# Patient Record
Sex: Male | Born: 1950
Health system: Southern US, Community
[De-identification: ages and names within clinical notes are randomized; demographics above are authoritative.]

## PROBLEM LIST (undated history)

## (undated) DIAGNOSIS — I2510 Atherosclerotic heart disease of native coronary artery without angina pectoris: Secondary | ICD-10-CM

## (undated) DIAGNOSIS — E785 Hyperlipidemia, unspecified: Secondary | ICD-10-CM

## (undated) DIAGNOSIS — I219 Acute myocardial infarction, unspecified: Secondary | ICD-10-CM

## (undated) DIAGNOSIS — I10 Essential (primary) hypertension: Secondary | ICD-10-CM

## (undated) HISTORY — PX: REPLACEMENT TOTAL KNEE: SUR1224

## (undated) HISTORY — PX: HIP ARTHROSCOPY W/ LABRAL REPAIR: SHX1750

## (undated) HISTORY — DX: Acute myocardial infarction, unspecified: I21.9

## (undated) HISTORY — DX: Atherosclerotic heart disease of native coronary artery without angina pectoris: I25.10

## (undated) HISTORY — DX: Essential (primary) hypertension: I10

## (undated) HISTORY — DX: Hyperlipidemia, unspecified: E78.5

---

## 2009-01-14 ENCOUNTER — Inpatient Hospital Stay (HOSPITAL_COMMUNITY): Admission: RE | Admit: 2009-01-14 | Discharge: 2009-01-16 | Payer: Self-pay | Admitting: Orthopedic Surgery

## 2010-11-03 LAB — TYPE AND SCREEN: Antibody Screen: NEGATIVE

## 2010-11-03 LAB — DIFFERENTIAL
Basophils Relative: 1 % (ref 0–1)
Lymphocytes Relative: 35 % (ref 12–46)
Monocytes Absolute: 0.4 10*3/uL (ref 0.1–1.0)
Monocytes Relative: 6 % (ref 3–12)
Neutro Abs: 3.2 10*3/uL (ref 1.7–7.7)

## 2010-11-03 LAB — BASIC METABOLIC PANEL
CO2: 29 mEq/L (ref 19–32)
Calcium: 8.4 mg/dL (ref 8.4–10.5)
Calcium: 9.6 mg/dL (ref 8.4–10.5)
Chloride: 103 mEq/L (ref 96–112)
GFR calc Af Amer: >60 mL/min (ref >60)
GFR calc Af Amer: >60 mL/min (ref >60)
GFR calc non Af Amer: >60 mL/min (ref >60)
Sodium: 137 mEq/L (ref 135–145)
Sodium: 139 mEq/L (ref 135–145)

## 2010-11-03 LAB — CBC
HCT: 34.7 % — ABNORMAL LOW (ref 39.0–52.0)
Hemoglobin: 12.1 g/dL — ABNORMAL LOW (ref 13.0–17.0)
Hemoglobin: 12.3 g/dL — ABNORMAL LOW (ref 13.0–17.0)
Hemoglobin: 15 g/dL (ref 13.0–17.0)
MCHC: 34.7 g/dL (ref 30.0–36.0)
Platelets: 208 10*3/uL (ref 150–400)
RBC: 3.87 MIL/uL — ABNORMAL LOW (ref 4.22–5.81)
RBC: 3.95 MIL/uL — ABNORMAL LOW (ref 4.22–5.81)
RBC: 4.81 MIL/uL (ref 4.22–5.81)
WBC: 7.8 10*3/uL (ref 4.0–10.5)
WBC: 8.9 10*3/uL (ref 4.0–10.5)

## 2010-11-03 LAB — APTT: aPTT: 28 seconds (ref 24–37)

## 2010-11-03 LAB — URINALYSIS, ROUTINE W REFLEX MICROSCOPIC
Nitrite: NEGATIVE
Specific Gravity, Urine: 1.015 (ref 1.005–1.030)
pH: 7.5 (ref 5.0–8.0)

## 2010-11-03 LAB — PROTIME-INR: INR: 1.1 (ref 0.00–1.49)

## 2010-12-09 NOTE — Op Note (Signed)
NAMETHOR, NANNINI                ACCOUNT NO.:  [id number]   MEDICAL RECORD NO.:  [id number]          PATIENT TYPE:  INP   LOCATION:  5002                         FACILITY:  [facility]   PHYSICIAN:  Feliberto Gottron. Turner Daniels, M.D.   DATE OF BIRTH:  1950/10/18   DATE OF PROCEDURE:  01/14/2009  DATE OF DISCHARGE:                               OPERATIVE REPORT   PREOPERATIVE DIAGNOSIS:  End-stage arthritis of left hip, osteoarthritic  type with some lateral subluxation of the femoral head.   POSTOPERATIVE DIAGNOSIS:  End-stage arthritis of left hip,  osteoarthritic type with some lateral subluxation of the femoral head.   PROCEDURE:  Left total hip arthroplasty using a DePuy 58-mm pinnacle  cup, a 0 looped 40-mm metal liner 20 x 15 x 42 SROM stem, 20B small  cone, NK +0 40-mm ball.   SURGEON:  Feliberto Gottron. Turner Daniels, MD   FIRST ASSISTANT:  Shirl Harris, PA-C   ANESTHETIC:  Endotracheal.   ESTIMATED BLOOD LOSS:  300 mL.   FLUID REPLACEMENT:  1500 mL of crystalloid.   DRAINS PLACED:  Foley catheter.   URINE OUTPUT:  300 mL.   INDICATIONS FOR PROCEDURE:  Very active, healthy 60 year old gentleman  with end-stage arthritis of his left hip, also has moderate-to-severe  arthritis of both knees, but has maintained generator clinically seems  be as left hip and he is down to bare-bone arthritic changes, has failed  conservative treatment, anti-inflammatory medicines, physical therapy,  he is about 6 feet 1-inch tall, weighs maybe 170 pounds.  He is very fit  and weight loss was not helped him too much.  In any event, he desires  elective left total hip arthroplasty, risks and benefits of surgery  discussed, questions answered.  Because he is very active 60 year old,  putting a large head and do a metal-on-metal bearing system.  Risks and  benefits of surgery have been discussed, questions answered.   DESCRIPTION OF PROCEDURE:  The patient was identified by armband and  received preoperative IV  antibiotics in the holding area at [facility], taken to operating room 10.  Appropriate site monitors were  attached and general endotracheal anesthesia induced with the patient in  supine position, rolled into the right lateral decubitus position after  insertion of a Foley catheter and the right lower extremity was prepped  and draped in usual sterile fashion from the ankle to hemipelvis.  We  then performed a standard time-out procedure and the skin along the  lateral hip and thigh was infiltrated with 20 mL of 0.5% Marcaine and  epinephrine solution.  We then made a posterolateral approach to the hip  joint centered over the greater trochanter about 15 cm in length cutting  through the skin and subcutaneous tissue down to the level of the IT  band, which was split in line with the muscle fibers exposing the  greater trochanter.  Cobra retractors were then placed between the  gluteus minimus and the superior hip joint capsule and between the  quadratus femoris and the inferior hip joint capsule.  This was isolated  with  short external rotators and piriformis, which were tagged with a #2  Ethibond suture and cut off their insertion on the intertrochanteric  crest.  This exposed the hip joint capsule which was then developed into  an acetabular-based flap going from posterior-superior off the  acetabular rim out over the femoral neck at the insertion of the capsule  and then exiting posterior-inferior off the acetabular rim.  The flap  was tagged to #2 Ethibond sutures exposing the arthritic bare-bone  femoral head.  The hip was flexed and internally rotated after releasing  about 50% of the rectus femoris fibers and dislocated.  A standard neck  cut was performed one fingerbreadth above the lesser trochanter, pretty  much perpendicular to the femoral neck and head was excised, examined,  and found to be down to bare bone.  We then translated the proximal  femur anteriorly  levering off the anterior column with a Homan  retractor.  A posterior inferior wing retractor was then placed at the  junction of the ileum and acetabulum and finally a spike Cobra was  placed in the cotyloid notch exposing the acetabulum.  The labrum was  then excised with electrocautery and we sequentially reamed up to a 57-  mm basket reamer obtaining good coverage in all quadrants and then just  touched the edge of the acetabulum of the 58-mm basket reamer not  seating it.  Peripheral osteophytes anteriorly and posteriorly were  removed at this time.  The wound was irrigated out with normal saline  solution and a 58-mm pinnacle cup was inserted at 45 degrees of  abduction and about 20 degrees of anteversion.  Excellent fit and fill  was accomplished and no supplemental screws were required.  The central  occluder was placed followed by the 40-mm metal liner, which was seated  nicely and was hammered into place.  The hip was then flexed and  internally rotated exposing the proximal femur, which was entered with  the box-cutting chisel followed by initiating reamer and then axial  reaming up to a 14-mm axial reamer in 1-mm increments.  We then went in  0.5-mm increments up to 15.5 obtaining chatter at 14, 14.5, 15 and went  half way down with a 15.5 reamer.  Satisfied with the femoral  preparation, we then conically reamed proximally to a 20B cone to the  appropriate depth for 42 base neck followed by calcar milling to a 20D  small calcar.  The trial cone was then placed, a trial stem with a 42  base neck in the same version as the neck of the femur about 15-20  degrees, was then hammered into place with an NK +0 trial ball.  The hip  reduced at 90 flexion, you could internally rotate to almost 75 degrees  before there was any instability in extension, you could externally  rotate to about 40 degrees and you could not dislocate anteriorly.  At  this point, the trial components were  removed.  The wound irrigated out  with normal saline solution and a 20B small CTT1 cone was hammered into  place followed by a 20 x 15 x 42 SROM stem in the same version as the  cone.  Once the stem was seated.  The trunnion was cleansed with saline  and then dried with a sponge and a NK +0 40-mm ball was hammered onto  the trunnion and the hip again reduced.  Stability was excellent.  This  capsular flap and short external  rotators were repaired back to the  intertrochanteric crest through drill holes.  The IT band was closed  with running #1 Vicryl suture, the subcutaneous tissue with 2-0 undyed  Vicryl suture because he was so slender and the skin with running  interlocking 3-0 nylon suture.  A dressing of Xeroform and Mepilex was  then applied.  The patient was unclamped, rolled supine, awakened and  taken to the recovery room without difficulty.      Feliberto Gottron. Turner Daniels, M.D.  Electronically Signed     FJR/MEDQ  D:  01/14/2009  T:  01/14/2009  Job:  [id number]

## 2010-12-12 NOTE — Discharge Summary (Signed)
Ruben Jackson, Ruben Jackson                ACCOUNT NO.:  [id number]   MEDICAL RECORD NO.:  [id number]          PATIENT TYPE:  INP   LOCATION:  5002                         FACILITY:  [facility]   PHYSICIAN:  Feliberto Gottron. Turner Daniels, M.D.   DATE OF BIRTH:  [birth year]   DATE OF ADMISSION:  01/14/2009  DATE OF DISCHARGE:  01/16/2009                               DISCHARGE SUMMARY   CHIEF COMPLAINT:  Left hip pain.   HISTORY OF PRESENT ILLNESS:  This is a 60 year old gentleman who  complains of severe unremitting pain in his left hip despite  conservative treatment with antiinflammatories, narcotic pain medicines,  and activity modifications.  He desires a surgical intervention at this  time.  All risks and benefits of surgery were discussed with the  patient.   PAST MEDICAL HISTORY:  Significant for hypertension.   PAST SURGICAL HISTORY:  He had a tonsillectomy at age 46.   ALLERGIES:  He has an allergy to PENICILLIN.   CURRENT MEDICATIONS:  1. Ibuprofen 200 mg one p.o. daily.  2. Aspirin 325 mg one p.o. daily.  3. Claritin 10 mg one p.o. daily.   SOCIAL HISTORY:  He denies use of alcohol or tobacco.   FAMILY HISTORY:  Positive for osteoarthritis.   PHYSICAL EXAMINATION:  Gross examination of the left hip demonstrates  the patient to have tenderness with internal rotation.  He walks with an  antalgic gait and is neurovascularly intact.  X-rays demonstrate bone-on-  bone degenerative joint disease of the left hip.   PREOPERATIVE LABORATORIES:  White blood cells 5.8, red blood cells 4.81,  hemoglobin 15, hematocrit 43.2, platelets 257.  PT 12.4, INR 0.9, PTT  28.  Sodium 139, potassium 4.6, chloride 103, glucose 108, BUN 15,  creatinine 0.89.   HOSPITAL COURSE:  Mr. Delcarlo was admitted to [facility] on January 14, 2009, when he underwent left total hip arthroplasty.  The procedure was  performed by Dr. Gean Birchwood and the patient tolerated it well.  A  perioperative Foley catheter was placed.   The patient was transferred  from recovery to the orthopedic floor and placed on Lovenox and Coumadin  for DVT prophylaxis.  On the first postoperative day, the patient was  awake and alert and tolerating p.o. intake well.  He denied any nausea  or vomiting and said that his pain was well controlled.  He was afebrile  and his hemoglobin was 12.3.  Surgical dressing was partially soaked.  He was evaluated by Physical Therapy and he ambulated 70 feet.  On the  second postoperative day, the patient was awake and alert.  He was  passing flatus and stool and continued to report good control of his  pain.  His hemoglobin was 12.1.  His dressing was partially soaked and  his incision was found to be benign.  He was discharged home after  physical therapy.   DISPOSITION:  The patient was discharged home on January 16, 2009.  He was  weightbearing as tolerated.  Home health would manage his wound,  Coumadin, and physical therapy.  He would return to  the clinic to see  Dr. Turner Daniels in 7-10 days for x-rays and suture removal.   Discharge medicines were as per the HMR with the addition of Percocet 5  mg 1-2 tablets p.o. q.4 h. p.r.n. pain and Coumadin to take as directed  with a target INR of 1.5 to 2.   FINAL DIAGNOSIS:  End-stage degenerative joint disease of the left hip.      Shirl Harris, PA      Feliberto Gottron. Turner Daniels, M.D.  Electronically Signed    JW/MEDQ  D:  02/06/2009  T:  02/07/2009  Job:  [id number]

## 2011-02-12 IMAGING — CR DG PORTABLE PELVIS
1 series · 1 of 1 positions shown · non-contrast
Comparison: None available.

CLINICAL DATA: Hip replacement.

PORTABLE PELVIS

[view not recorded]
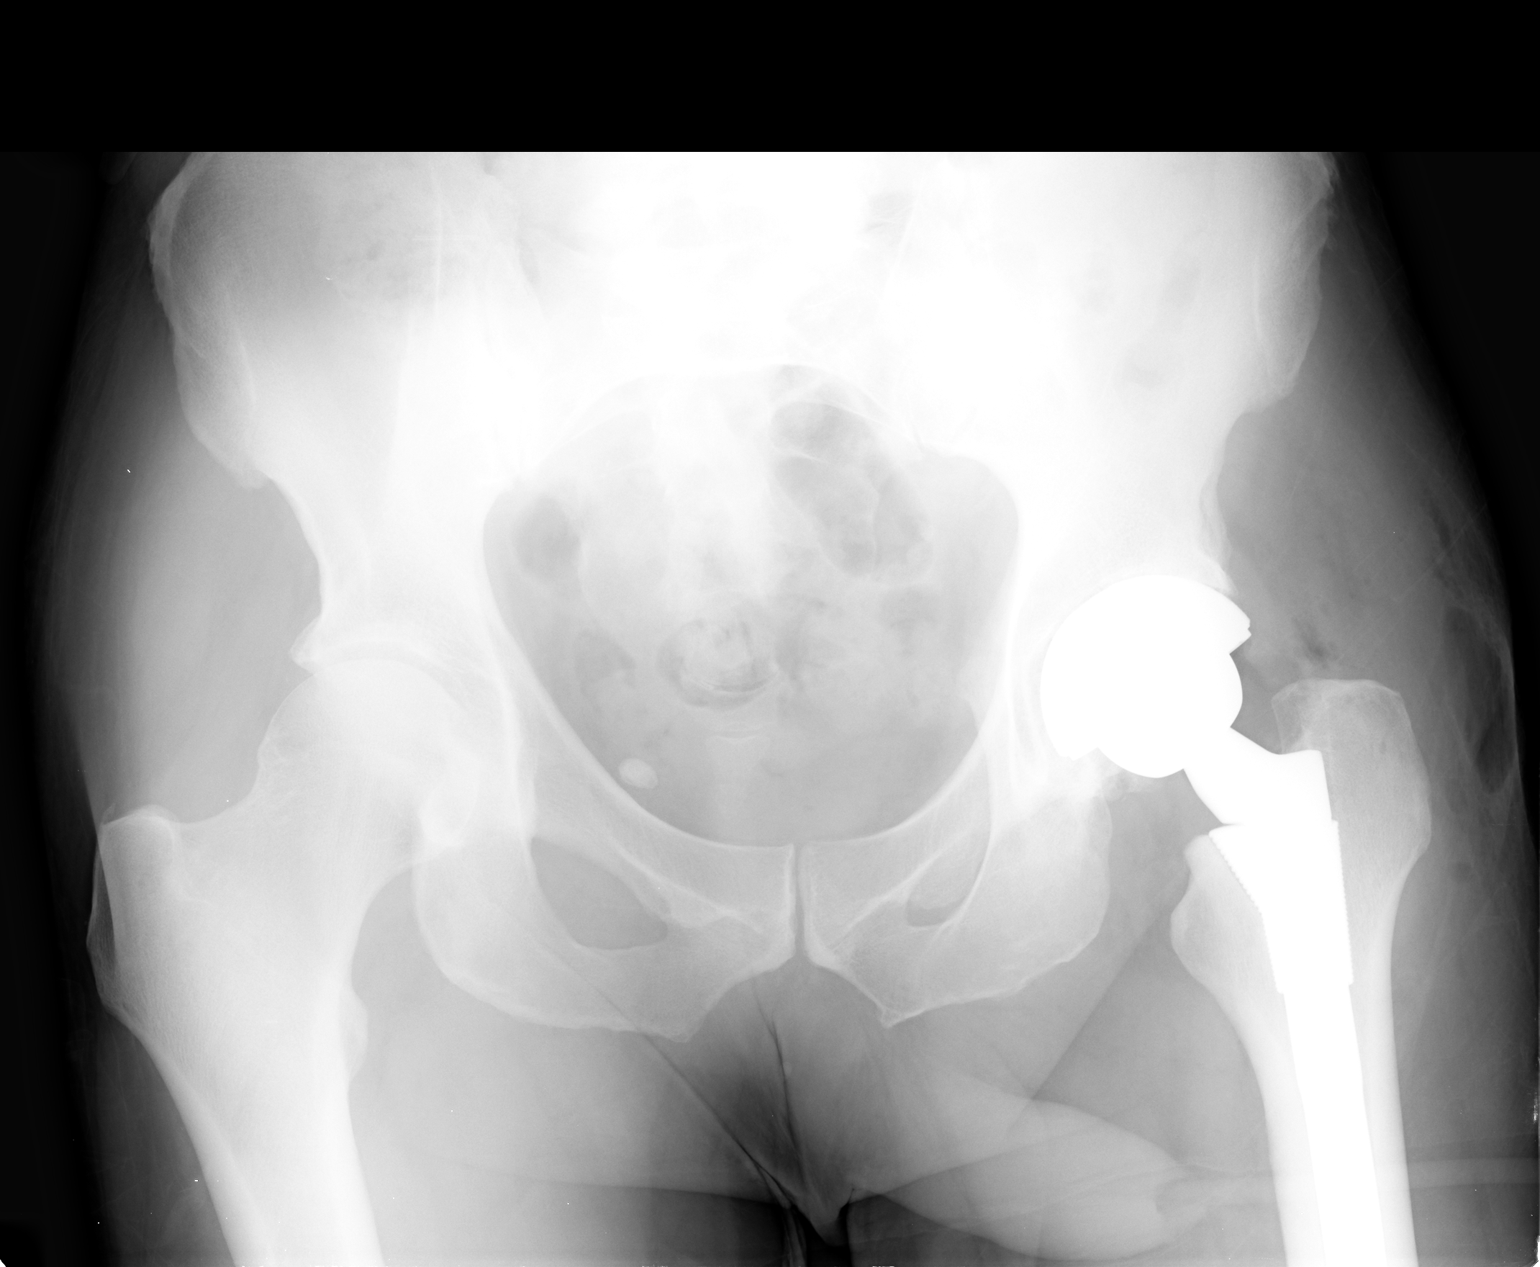

[1 of 1 positions shown; findings below may reference images not displayed]

FINDINGS: The patient has a left total hip arthroplasty.  The
device is located.  No fracture is identified.  Gas in the soft
tissues from surgery noted.  Right hip osteoarthritis also noted.
IMPRESSION: Left total hip replacement without evidence of complication.

## 2014-07-16 DIAGNOSIS — S8991XA Unspecified injury of right lower leg, initial encounter: Secondary | ICD-10-CM | POA: Diagnosis not present

## 2014-07-16 DIAGNOSIS — S81831A Puncture wound without foreign body, right lower leg, initial encounter: Secondary | ICD-10-CM | POA: Diagnosis not present

## 2014-07-24 DIAGNOSIS — N400 Enlarged prostate without lower urinary tract symptoms: Secondary | ICD-10-CM | POA: Diagnosis not present

## 2014-07-24 DIAGNOSIS — I2510 Atherosclerotic heart disease of native coronary artery without angina pectoris: Secondary | ICD-10-CM | POA: Diagnosis not present

## 2014-07-24 DIAGNOSIS — I10 Essential (primary) hypertension: Secondary | ICD-10-CM | POA: Diagnosis not present

## 2014-07-24 DIAGNOSIS — E785 Hyperlipidemia, unspecified: Secondary | ICD-10-CM | POA: Diagnosis not present

## 2014-08-03 DIAGNOSIS — E785 Hyperlipidemia, unspecified: Secondary | ICD-10-CM | POA: Diagnosis not present

## 2014-08-03 DIAGNOSIS — I252 Old myocardial infarction: Secondary | ICD-10-CM | POA: Diagnosis not present

## 2014-08-03 DIAGNOSIS — I2510 Atherosclerotic heart disease of native coronary artery without angina pectoris: Secondary | ICD-10-CM | POA: Diagnosis not present

## 2014-09-07 DIAGNOSIS — M12532 Traumatic arthropathy, left wrist: Secondary | ICD-10-CM | POA: Diagnosis not present

## 2014-09-22 DIAGNOSIS — D225 Melanocytic nevi of trunk: Secondary | ICD-10-CM | POA: Diagnosis not present

## 2014-09-22 DIAGNOSIS — D485 Neoplasm of uncertain behavior of skin: Secondary | ICD-10-CM | POA: Diagnosis not present

## 2014-09-22 DIAGNOSIS — L820 Inflamed seborrheic keratosis: Secondary | ICD-10-CM | POA: Diagnosis not present

## 2014-11-02 DIAGNOSIS — E785 Hyperlipidemia, unspecified: Secondary | ICD-10-CM | POA: Diagnosis not present

## 2014-11-02 DIAGNOSIS — E781 Pure hyperglyceridemia: Secondary | ICD-10-CM | POA: Diagnosis not present

## 2014-11-02 DIAGNOSIS — I10 Essential (primary) hypertension: Secondary | ICD-10-CM | POA: Diagnosis not present

## 2014-11-02 DIAGNOSIS — M791 Myalgia: Secondary | ICD-10-CM | POA: Diagnosis not present

## 2014-11-30 DIAGNOSIS — H524 Presbyopia: Secondary | ICD-10-CM | POA: Diagnosis not present

## 2014-11-30 DIAGNOSIS — H43813 Vitreous degeneration, bilateral: Secondary | ICD-10-CM | POA: Diagnosis not present

## 2014-11-30 DIAGNOSIS — H2513 Age-related nuclear cataract, bilateral: Secondary | ICD-10-CM | POA: Diagnosis not present

## 2014-12-07 DIAGNOSIS — I2510 Atherosclerotic heart disease of native coronary artery without angina pectoris: Secondary | ICD-10-CM | POA: Diagnosis not present

## 2014-12-07 DIAGNOSIS — E785 Hyperlipidemia, unspecified: Secondary | ICD-10-CM | POA: Diagnosis not present

## 2014-12-07 DIAGNOSIS — I10 Essential (primary) hypertension: Secondary | ICD-10-CM | POA: Diagnosis not present

## 2015-01-12 DIAGNOSIS — M67432 Ganglion, left wrist: Secondary | ICD-10-CM | POA: Diagnosis not present

## 2015-03-22 DIAGNOSIS — E785 Hyperlipidemia, unspecified: Secondary | ICD-10-CM | POA: Diagnosis not present

## 2015-03-22 DIAGNOSIS — I10 Essential (primary) hypertension: Secondary | ICD-10-CM | POA: Diagnosis not present

## 2015-03-22 DIAGNOSIS — N529 Male erectile dysfunction, unspecified: Secondary | ICD-10-CM | POA: Diagnosis not present

## 2015-03-22 DIAGNOSIS — I2510 Atherosclerotic heart disease of native coronary artery without angina pectoris: Secondary | ICD-10-CM | POA: Diagnosis not present

## 2015-06-14 DIAGNOSIS — H532 Diplopia: Secondary | ICD-10-CM | POA: Diagnosis not present

## 2015-07-05 DIAGNOSIS — N400 Enlarged prostate without lower urinary tract symptoms: Secondary | ICD-10-CM | POA: Diagnosis not present

## 2015-07-05 DIAGNOSIS — E785 Hyperlipidemia, unspecified: Secondary | ICD-10-CM | POA: Diagnosis not present

## 2015-07-05 DIAGNOSIS — I10 Essential (primary) hypertension: Secondary | ICD-10-CM | POA: Diagnosis not present

## 2015-07-05 DIAGNOSIS — Z23 Encounter for immunization: Secondary | ICD-10-CM | POA: Diagnosis not present

## 2015-07-05 DIAGNOSIS — I2510 Atherosclerotic heart disease of native coronary artery without angina pectoris: Secondary | ICD-10-CM | POA: Diagnosis not present

## 2015-07-05 DIAGNOSIS — N529 Male erectile dysfunction, unspecified: Secondary | ICD-10-CM | POA: Diagnosis not present

## 2015-08-23 DIAGNOSIS — N529 Male erectile dysfunction, unspecified: Secondary | ICD-10-CM | POA: Diagnosis not present

## 2015-09-20 DIAGNOSIS — N529 Male erectile dysfunction, unspecified: Secondary | ICD-10-CM | POA: Diagnosis not present

## 2015-10-18 DIAGNOSIS — N529 Male erectile dysfunction, unspecified: Secondary | ICD-10-CM | POA: Diagnosis not present

## 2015-10-18 DIAGNOSIS — R1031 Right lower quadrant pain: Secondary | ICD-10-CM | POA: Diagnosis not present

## 2015-10-18 DIAGNOSIS — I2510 Atherosclerotic heart disease of native coronary artery without angina pectoris: Secondary | ICD-10-CM | POA: Diagnosis not present

## 2015-10-18 DIAGNOSIS — E785 Hyperlipidemia, unspecified: Secondary | ICD-10-CM | POA: Diagnosis not present

## 2015-10-18 DIAGNOSIS — I10 Essential (primary) hypertension: Secondary | ICD-10-CM | POA: Diagnosis not present

## 2015-11-18 DIAGNOSIS — N529 Male erectile dysfunction, unspecified: Secondary | ICD-10-CM | POA: Diagnosis not present

## 2015-11-19 DIAGNOSIS — M1712 Unilateral primary osteoarthritis, left knee: Secondary | ICD-10-CM | POA: Diagnosis not present

## 2015-12-18 DIAGNOSIS — N529 Male erectile dysfunction, unspecified: Secondary | ICD-10-CM | POA: Diagnosis not present

## 2015-12-29 DIAGNOSIS — H81399 Other peripheral vertigo, unspecified ear: Secondary | ICD-10-CM | POA: Diagnosis not present

## 2016-02-10 DIAGNOSIS — E785 Hyperlipidemia, unspecified: Secondary | ICD-10-CM | POA: Insufficient documentation

## 2016-02-10 DIAGNOSIS — I2510 Atherosclerotic heart disease of native coronary artery without angina pectoris: Secondary | ICD-10-CM

## 2016-02-10 DIAGNOSIS — I252 Old myocardial infarction: Secondary | ICD-10-CM | POA: Insufficient documentation

## 2016-02-10 HISTORY — DX: Atherosclerotic heart disease of native coronary artery without angina pectoris: I25.10

## 2016-02-10 HISTORY — DX: Old myocardial infarction: I25.2

## 2016-02-11 DIAGNOSIS — Z789 Other specified health status: Secondary | ICD-10-CM

## 2016-02-11 DIAGNOSIS — E785 Hyperlipidemia, unspecified: Secondary | ICD-10-CM | POA: Diagnosis not present

## 2016-02-11 DIAGNOSIS — I252 Old myocardial infarction: Secondary | ICD-10-CM | POA: Diagnosis not present

## 2016-02-11 DIAGNOSIS — M6281 Muscle weakness (generalized): Secondary | ICD-10-CM | POA: Insufficient documentation

## 2016-02-11 DIAGNOSIS — I2510 Atherosclerotic heart disease of native coronary artery without angina pectoris: Secondary | ICD-10-CM | POA: Diagnosis not present

## 2016-02-11 HISTORY — DX: Other specified health status: Z78.9

## 2016-02-11 HISTORY — DX: Muscle weakness (generalized): M62.81

## 2016-03-25 ENCOUNTER — Other Ambulatory Visit: Payer: Self-pay

## 2016-03-31 DIAGNOSIS — Z6823 Body mass index (BMI) 23.0-23.9, adult: Secondary | ICD-10-CM | POA: Diagnosis not present

## 2016-03-31 DIAGNOSIS — I2510 Atherosclerotic heart disease of native coronary artery without angina pectoris: Secondary | ICD-10-CM | POA: Diagnosis not present

## 2016-03-31 DIAGNOSIS — I10 Essential (primary) hypertension: Secondary | ICD-10-CM | POA: Diagnosis not present

## 2016-03-31 DIAGNOSIS — E785 Hyperlipidemia, unspecified: Secondary | ICD-10-CM | POA: Diagnosis not present

## 2016-03-31 DIAGNOSIS — Z23 Encounter for immunization: Secondary | ICD-10-CM | POA: Diagnosis not present

## 2016-04-20 DIAGNOSIS — I10 Essential (primary) hypertension: Secondary | ICD-10-CM | POA: Diagnosis not present

## 2016-04-20 DIAGNOSIS — M791 Myalgia: Secondary | ICD-10-CM | POA: Diagnosis not present

## 2016-06-11 DIAGNOSIS — M1711 Unilateral primary osteoarthritis, right knee: Secondary | ICD-10-CM | POA: Diagnosis not present

## 2016-06-15 DIAGNOSIS — W5503XA Scratched by cat, initial encounter: Secondary | ICD-10-CM | POA: Diagnosis not present

## 2016-06-15 DIAGNOSIS — S60519A Abrasion of unspecified hand, initial encounter: Secondary | ICD-10-CM | POA: Diagnosis not present

## 2016-06-24 DIAGNOSIS — Z01810 Encounter for preprocedural cardiovascular examination: Secondary | ICD-10-CM | POA: Diagnosis not present

## 2016-06-24 DIAGNOSIS — R52 Pain, unspecified: Secondary | ICD-10-CM | POA: Diagnosis not present

## 2016-06-24 DIAGNOSIS — Z01818 Encounter for other preprocedural examination: Secondary | ICD-10-CM | POA: Diagnosis not present

## 2016-06-24 DIAGNOSIS — M79609 Pain in unspecified limb: Secondary | ICD-10-CM | POA: Diagnosis not present

## 2016-06-24 DIAGNOSIS — Z79899 Other long term (current) drug therapy: Secondary | ICD-10-CM | POA: Diagnosis not present

## 2016-06-29 DIAGNOSIS — I2510 Atherosclerotic heart disease of native coronary artery without angina pectoris: Secondary | ICD-10-CM | POA: Diagnosis not present

## 2016-06-29 DIAGNOSIS — Z1389 Encounter for screening for other disorder: Secondary | ICD-10-CM | POA: Diagnosis not present

## 2016-06-29 DIAGNOSIS — H6121 Impacted cerumen, right ear: Secondary | ICD-10-CM | POA: Diagnosis not present

## 2016-06-29 DIAGNOSIS — R351 Nocturia: Secondary | ICD-10-CM | POA: Diagnosis not present

## 2016-06-29 DIAGNOSIS — E782 Mixed hyperlipidemia: Secondary | ICD-10-CM | POA: Diagnosis not present

## 2016-06-29 DIAGNOSIS — I10 Essential (primary) hypertension: Secondary | ICD-10-CM | POA: Diagnosis not present

## 2016-06-29 DIAGNOSIS — Z6823 Body mass index (BMI) 23.0-23.9, adult: Secondary | ICD-10-CM | POA: Diagnosis not present

## 2016-06-29 DIAGNOSIS — Z1212 Encounter for screening for malignant neoplasm of rectum: Secondary | ICD-10-CM | POA: Diagnosis not present

## 2016-06-29 DIAGNOSIS — E785 Hyperlipidemia, unspecified: Secondary | ICD-10-CM | POA: Diagnosis not present

## 2016-07-09 DIAGNOSIS — Z1212 Encounter for screening for malignant neoplasm of rectum: Secondary | ICD-10-CM | POA: Diagnosis not present

## 2016-07-09 DIAGNOSIS — Z1211 Encounter for screening for malignant neoplasm of colon: Secondary | ICD-10-CM | POA: Diagnosis not present

## 2016-07-14 DIAGNOSIS — J0100 Acute maxillary sinusitis, unspecified: Secondary | ICD-10-CM | POA: Diagnosis not present

## 2016-08-10 DIAGNOSIS — Z01818 Encounter for other preprocedural examination: Secondary | ICD-10-CM | POA: Diagnosis not present

## 2016-08-10 DIAGNOSIS — Z79899 Other long term (current) drug therapy: Secondary | ICD-10-CM | POA: Diagnosis not present

## 2016-08-10 DIAGNOSIS — M1711 Unilateral primary osteoarthritis, right knee: Secondary | ICD-10-CM | POA: Diagnosis not present

## 2016-08-19 DIAGNOSIS — I252 Old myocardial infarction: Secondary | ICD-10-CM | POA: Diagnosis not present

## 2016-08-19 DIAGNOSIS — Z7982 Long term (current) use of aspirin: Secondary | ICD-10-CM | POA: Diagnosis not present

## 2016-08-19 DIAGNOSIS — Z880 Allergy status to penicillin: Secondary | ICD-10-CM | POA: Diagnosis not present

## 2016-08-19 DIAGNOSIS — M1711 Unilateral primary osteoarthritis, right knee: Secondary | ICD-10-CM | POA: Diagnosis not present

## 2016-08-19 DIAGNOSIS — I10 Essential (primary) hypertension: Secondary | ICD-10-CM | POA: Diagnosis present

## 2016-08-19 DIAGNOSIS — G8918 Other acute postprocedural pain: Secondary | ICD-10-CM | POA: Diagnosis not present

## 2016-08-19 DIAGNOSIS — Z79899 Other long term (current) drug therapy: Secondary | ICD-10-CM | POA: Diagnosis not present

## 2016-08-19 DIAGNOSIS — E785 Hyperlipidemia, unspecified: Secondary | ICD-10-CM | POA: Diagnosis present

## 2016-08-19 DIAGNOSIS — Z96651 Presence of right artificial knee joint: Secondary | ICD-10-CM | POA: Diagnosis not present

## 2016-08-19 DIAGNOSIS — M7989 Other specified soft tissue disorders: Secondary | ICD-10-CM | POA: Diagnosis not present

## 2016-08-19 DIAGNOSIS — Z955 Presence of coronary angioplasty implant and graft: Secondary | ICD-10-CM | POA: Diagnosis not present

## 2016-08-19 DIAGNOSIS — Z471 Aftercare following joint replacement surgery: Secondary | ICD-10-CM | POA: Diagnosis not present

## 2016-08-23 DIAGNOSIS — Z471 Aftercare following joint replacement surgery: Secondary | ICD-10-CM | POA: Diagnosis not present

## 2016-08-23 DIAGNOSIS — I2510 Atherosclerotic heart disease of native coronary artery without angina pectoris: Secondary | ICD-10-CM | POA: Diagnosis not present

## 2016-08-23 DIAGNOSIS — I10 Essential (primary) hypertension: Secondary | ICD-10-CM | POA: Diagnosis not present

## 2016-08-23 DIAGNOSIS — Z7982 Long term (current) use of aspirin: Secondary | ICD-10-CM | POA: Diagnosis not present

## 2016-08-23 DIAGNOSIS — Z96651 Presence of right artificial knee joint: Secondary | ICD-10-CM | POA: Diagnosis not present

## 2016-08-24 DIAGNOSIS — Z96651 Presence of right artificial knee joint: Secondary | ICD-10-CM | POA: Diagnosis not present

## 2016-08-24 DIAGNOSIS — I10 Essential (primary) hypertension: Secondary | ICD-10-CM | POA: Diagnosis not present

## 2016-08-24 DIAGNOSIS — I2510 Atherosclerotic heart disease of native coronary artery without angina pectoris: Secondary | ICD-10-CM | POA: Diagnosis not present

## 2016-08-24 DIAGNOSIS — Z7982 Long term (current) use of aspirin: Secondary | ICD-10-CM | POA: Diagnosis not present

## 2016-08-24 DIAGNOSIS — Z471 Aftercare following joint replacement surgery: Secondary | ICD-10-CM | POA: Diagnosis not present

## 2016-08-25 DIAGNOSIS — Z471 Aftercare following joint replacement surgery: Secondary | ICD-10-CM | POA: Diagnosis not present

## 2016-08-25 DIAGNOSIS — I2510 Atherosclerotic heart disease of native coronary artery without angina pectoris: Secondary | ICD-10-CM | POA: Diagnosis not present

## 2016-08-25 DIAGNOSIS — Z7982 Long term (current) use of aspirin: Secondary | ICD-10-CM | POA: Diagnosis not present

## 2016-08-25 DIAGNOSIS — Z96651 Presence of right artificial knee joint: Secondary | ICD-10-CM | POA: Diagnosis not present

## 2016-08-25 DIAGNOSIS — I10 Essential (primary) hypertension: Secondary | ICD-10-CM | POA: Diagnosis not present

## 2016-08-26 DIAGNOSIS — Z471 Aftercare following joint replacement surgery: Secondary | ICD-10-CM | POA: Diagnosis not present

## 2016-08-26 DIAGNOSIS — Z96651 Presence of right artificial knee joint: Secondary | ICD-10-CM | POA: Diagnosis not present

## 2016-08-26 DIAGNOSIS — I10 Essential (primary) hypertension: Secondary | ICD-10-CM | POA: Diagnosis not present

## 2016-08-26 DIAGNOSIS — Z7982 Long term (current) use of aspirin: Secondary | ICD-10-CM | POA: Diagnosis not present

## 2016-08-26 DIAGNOSIS — I2510 Atherosclerotic heart disease of native coronary artery without angina pectoris: Secondary | ICD-10-CM | POA: Diagnosis not present

## 2016-08-27 DIAGNOSIS — I10 Essential (primary) hypertension: Secondary | ICD-10-CM | POA: Diagnosis not present

## 2016-08-27 DIAGNOSIS — Z471 Aftercare following joint replacement surgery: Secondary | ICD-10-CM | POA: Diagnosis not present

## 2016-08-27 DIAGNOSIS — Z7982 Long term (current) use of aspirin: Secondary | ICD-10-CM | POA: Diagnosis not present

## 2016-08-27 DIAGNOSIS — Z96651 Presence of right artificial knee joint: Secondary | ICD-10-CM | POA: Diagnosis not present

## 2016-08-27 DIAGNOSIS — I2510 Atherosclerotic heart disease of native coronary artery without angina pectoris: Secondary | ICD-10-CM | POA: Diagnosis not present

## 2016-08-28 DIAGNOSIS — Z7982 Long term (current) use of aspirin: Secondary | ICD-10-CM | POA: Diagnosis not present

## 2016-08-28 DIAGNOSIS — I10 Essential (primary) hypertension: Secondary | ICD-10-CM | POA: Diagnosis not present

## 2016-08-28 DIAGNOSIS — I2510 Atherosclerotic heart disease of native coronary artery without angina pectoris: Secondary | ICD-10-CM | POA: Diagnosis not present

## 2016-08-28 DIAGNOSIS — Z471 Aftercare following joint replacement surgery: Secondary | ICD-10-CM | POA: Diagnosis not present

## 2016-08-28 DIAGNOSIS — Z96651 Presence of right artificial knee joint: Secondary | ICD-10-CM | POA: Diagnosis not present

## 2016-08-31 DIAGNOSIS — I2510 Atherosclerotic heart disease of native coronary artery without angina pectoris: Secondary | ICD-10-CM | POA: Diagnosis not present

## 2016-08-31 DIAGNOSIS — Z7982 Long term (current) use of aspirin: Secondary | ICD-10-CM | POA: Diagnosis not present

## 2016-08-31 DIAGNOSIS — Z471 Aftercare following joint replacement surgery: Secondary | ICD-10-CM | POA: Diagnosis not present

## 2016-08-31 DIAGNOSIS — Z96651 Presence of right artificial knee joint: Secondary | ICD-10-CM | POA: Diagnosis not present

## 2016-08-31 DIAGNOSIS — I10 Essential (primary) hypertension: Secondary | ICD-10-CM | POA: Diagnosis not present

## 2016-09-02 DIAGNOSIS — M1711 Unilateral primary osteoarthritis, right knee: Secondary | ICD-10-CM | POA: Diagnosis not present

## 2016-09-02 DIAGNOSIS — R2689 Other abnormalities of gait and mobility: Secondary | ICD-10-CM | POA: Diagnosis not present

## 2016-09-02 DIAGNOSIS — M25561 Pain in right knee: Secondary | ICD-10-CM | POA: Diagnosis not present

## 2016-09-04 DIAGNOSIS — M1711 Unilateral primary osteoarthritis, right knee: Secondary | ICD-10-CM | POA: Diagnosis not present

## 2016-09-04 DIAGNOSIS — R2689 Other abnormalities of gait and mobility: Secondary | ICD-10-CM | POA: Diagnosis not present

## 2016-09-04 DIAGNOSIS — M25561 Pain in right knee: Secondary | ICD-10-CM | POA: Diagnosis not present

## 2016-09-08 DIAGNOSIS — R2689 Other abnormalities of gait and mobility: Secondary | ICD-10-CM | POA: Diagnosis not present

## 2016-09-08 DIAGNOSIS — M1711 Unilateral primary osteoarthritis, right knee: Secondary | ICD-10-CM | POA: Diagnosis not present

## 2016-09-08 DIAGNOSIS — M25561 Pain in right knee: Secondary | ICD-10-CM | POA: Diagnosis not present

## 2016-09-11 DIAGNOSIS — M25561 Pain in right knee: Secondary | ICD-10-CM | POA: Diagnosis not present

## 2016-09-11 DIAGNOSIS — M1711 Unilateral primary osteoarthritis, right knee: Secondary | ICD-10-CM | POA: Diagnosis not present

## 2016-09-11 DIAGNOSIS — R2689 Other abnormalities of gait and mobility: Secondary | ICD-10-CM | POA: Diagnosis not present

## 2016-09-15 DIAGNOSIS — M1711 Unilateral primary osteoarthritis, right knee: Secondary | ICD-10-CM | POA: Diagnosis not present

## 2016-09-15 DIAGNOSIS — M25561 Pain in right knee: Secondary | ICD-10-CM | POA: Diagnosis not present

## 2016-09-15 DIAGNOSIS — R2689 Other abnormalities of gait and mobility: Secondary | ICD-10-CM | POA: Diagnosis not present

## 2016-09-18 DIAGNOSIS — M25561 Pain in right knee: Secondary | ICD-10-CM | POA: Diagnosis not present

## 2016-09-18 DIAGNOSIS — R2689 Other abnormalities of gait and mobility: Secondary | ICD-10-CM | POA: Diagnosis not present

## 2016-09-18 DIAGNOSIS — M1711 Unilateral primary osteoarthritis, right knee: Secondary | ICD-10-CM | POA: Diagnosis not present

## 2016-09-22 DIAGNOSIS — M1711 Unilateral primary osteoarthritis, right knee: Secondary | ICD-10-CM | POA: Diagnosis not present

## 2016-09-22 DIAGNOSIS — R2689 Other abnormalities of gait and mobility: Secondary | ICD-10-CM | POA: Diagnosis not present

## 2016-09-22 DIAGNOSIS — M25561 Pain in right knee: Secondary | ICD-10-CM | POA: Diagnosis not present

## 2016-09-25 DIAGNOSIS — M25561 Pain in right knee: Secondary | ICD-10-CM | POA: Diagnosis not present

## 2016-09-25 DIAGNOSIS — R2689 Other abnormalities of gait and mobility: Secondary | ICD-10-CM | POA: Diagnosis not present

## 2016-09-25 DIAGNOSIS — M1711 Unilateral primary osteoarthritis, right knee: Secondary | ICD-10-CM | POA: Diagnosis not present

## 2016-09-29 DIAGNOSIS — M1711 Unilateral primary osteoarthritis, right knee: Secondary | ICD-10-CM | POA: Diagnosis not present

## 2016-09-29 DIAGNOSIS — M1712 Unilateral primary osteoarthritis, left knee: Secondary | ICD-10-CM | POA: Diagnosis not present

## 2016-09-29 DIAGNOSIS — Z96651 Presence of right artificial knee joint: Secondary | ICD-10-CM | POA: Diagnosis not present

## 2016-09-29 DIAGNOSIS — R2689 Other abnormalities of gait and mobility: Secondary | ICD-10-CM | POA: Diagnosis not present

## 2016-09-29 DIAGNOSIS — M25561 Pain in right knee: Secondary | ICD-10-CM | POA: Diagnosis not present

## 2016-09-30 DIAGNOSIS — R52 Pain, unspecified: Secondary | ICD-10-CM | POA: Diagnosis not present

## 2016-09-30 DIAGNOSIS — Z01818 Encounter for other preprocedural examination: Secondary | ICD-10-CM | POA: Diagnosis not present

## 2016-09-30 DIAGNOSIS — Z01810 Encounter for preprocedural cardiovascular examination: Secondary | ICD-10-CM | POA: Diagnosis not present

## 2016-09-30 DIAGNOSIS — Z79899 Other long term (current) drug therapy: Secondary | ICD-10-CM | POA: Diagnosis not present

## 2016-09-30 DIAGNOSIS — Z96652 Presence of left artificial knee joint: Secondary | ICD-10-CM | POA: Diagnosis not present

## 2016-09-30 DIAGNOSIS — Z471 Aftercare following joint replacement surgery: Secondary | ICD-10-CM | POA: Diagnosis not present

## 2016-09-30 DIAGNOSIS — M79609 Pain in unspecified limb: Secondary | ICD-10-CM | POA: Diagnosis not present

## 2016-10-01 DIAGNOSIS — R2689 Other abnormalities of gait and mobility: Secondary | ICD-10-CM | POA: Diagnosis not present

## 2016-10-01 DIAGNOSIS — M25561 Pain in right knee: Secondary | ICD-10-CM | POA: Diagnosis not present

## 2016-10-01 DIAGNOSIS — M1711 Unilateral primary osteoarthritis, right knee: Secondary | ICD-10-CM | POA: Diagnosis not present

## 2016-10-06 DIAGNOSIS — G8929 Other chronic pain: Secondary | ICD-10-CM | POA: Diagnosis not present

## 2016-10-06 DIAGNOSIS — Z01818 Encounter for other preprocedural examination: Secondary | ICD-10-CM | POA: Diagnosis not present

## 2016-10-06 DIAGNOSIS — M25561 Pain in right knee: Secondary | ICD-10-CM | POA: Diagnosis not present

## 2016-10-06 DIAGNOSIS — M1711 Unilateral primary osteoarthritis, right knee: Secondary | ICD-10-CM | POA: Diagnosis not present

## 2016-10-06 DIAGNOSIS — I10 Essential (primary) hypertension: Secondary | ICD-10-CM | POA: Diagnosis not present

## 2016-10-06 DIAGNOSIS — E785 Hyperlipidemia, unspecified: Secondary | ICD-10-CM | POA: Diagnosis not present

## 2016-10-06 DIAGNOSIS — R2689 Other abnormalities of gait and mobility: Secondary | ICD-10-CM | POA: Diagnosis not present

## 2016-10-08 DIAGNOSIS — M1711 Unilateral primary osteoarthritis, right knee: Secondary | ICD-10-CM | POA: Diagnosis not present

## 2016-10-08 DIAGNOSIS — M25561 Pain in right knee: Secondary | ICD-10-CM | POA: Diagnosis not present

## 2016-10-08 DIAGNOSIS — R2689 Other abnormalities of gait and mobility: Secondary | ICD-10-CM | POA: Diagnosis not present

## 2016-10-19 DIAGNOSIS — M1712 Unilateral primary osteoarthritis, left knee: Secondary | ICD-10-CM | POA: Diagnosis not present

## 2016-10-19 DIAGNOSIS — Z01818 Encounter for other preprocedural examination: Secondary | ICD-10-CM | POA: Diagnosis not present

## 2016-11-18 DIAGNOSIS — E785 Hyperlipidemia, unspecified: Secondary | ICD-10-CM | POA: Diagnosis present

## 2016-11-18 DIAGNOSIS — Z79899 Other long term (current) drug therapy: Secondary | ICD-10-CM | POA: Diagnosis not present

## 2016-11-18 DIAGNOSIS — M1712 Unilateral primary osteoarthritis, left knee: Secondary | ICD-10-CM | POA: Diagnosis present

## 2016-11-18 DIAGNOSIS — Z79891 Long term (current) use of opiate analgesic: Secondary | ICD-10-CM | POA: Diagnosis not present

## 2016-11-18 DIAGNOSIS — I252 Old myocardial infarction: Secondary | ICD-10-CM | POA: Diagnosis not present

## 2016-11-18 DIAGNOSIS — I10 Essential (primary) hypertension: Secondary | ICD-10-CM | POA: Diagnosis present

## 2016-11-18 DIAGNOSIS — Z7982 Long term (current) use of aspirin: Secondary | ICD-10-CM | POA: Diagnosis not present

## 2016-11-18 DIAGNOSIS — I2510 Atherosclerotic heart disease of native coronary artery without angina pectoris: Secondary | ICD-10-CM | POA: Diagnosis not present

## 2016-11-18 DIAGNOSIS — Z471 Aftercare following joint replacement surgery: Secondary | ICD-10-CM | POA: Diagnosis not present

## 2016-11-18 DIAGNOSIS — Z96652 Presence of left artificial knee joint: Secondary | ICD-10-CM | POA: Diagnosis not present

## 2016-11-20 DIAGNOSIS — Z7982 Long term (current) use of aspirin: Secondary | ICD-10-CM | POA: Diagnosis not present

## 2016-11-20 DIAGNOSIS — Z79891 Long term (current) use of opiate analgesic: Secondary | ICD-10-CM | POA: Diagnosis not present

## 2016-11-20 DIAGNOSIS — I2510 Atherosclerotic heart disease of native coronary artery without angina pectoris: Secondary | ICD-10-CM | POA: Diagnosis not present

## 2016-11-20 DIAGNOSIS — Z471 Aftercare following joint replacement surgery: Secondary | ICD-10-CM | POA: Diagnosis not present

## 2016-11-20 DIAGNOSIS — Z96652 Presence of left artificial knee joint: Secondary | ICD-10-CM | POA: Diagnosis not present

## 2016-11-20 DIAGNOSIS — I10 Essential (primary) hypertension: Secondary | ICD-10-CM | POA: Diagnosis not present

## 2016-11-22 DIAGNOSIS — I10 Essential (primary) hypertension: Secondary | ICD-10-CM | POA: Diagnosis not present

## 2016-11-22 DIAGNOSIS — Z79891 Long term (current) use of opiate analgesic: Secondary | ICD-10-CM | POA: Diagnosis not present

## 2016-11-22 DIAGNOSIS — Z96652 Presence of left artificial knee joint: Secondary | ICD-10-CM | POA: Diagnosis not present

## 2016-11-22 DIAGNOSIS — I2510 Atherosclerotic heart disease of native coronary artery without angina pectoris: Secondary | ICD-10-CM | POA: Diagnosis not present

## 2016-11-22 DIAGNOSIS — Z471 Aftercare following joint replacement surgery: Secondary | ICD-10-CM | POA: Diagnosis not present

## 2016-11-22 DIAGNOSIS — Z7982 Long term (current) use of aspirin: Secondary | ICD-10-CM | POA: Diagnosis not present

## 2016-11-23 DIAGNOSIS — I2510 Atherosclerotic heart disease of native coronary artery without angina pectoris: Secondary | ICD-10-CM | POA: Diagnosis not present

## 2016-11-23 DIAGNOSIS — Z471 Aftercare following joint replacement surgery: Secondary | ICD-10-CM | POA: Diagnosis not present

## 2016-11-23 DIAGNOSIS — Z79891 Long term (current) use of opiate analgesic: Secondary | ICD-10-CM | POA: Diagnosis not present

## 2016-11-23 DIAGNOSIS — Z7982 Long term (current) use of aspirin: Secondary | ICD-10-CM | POA: Diagnosis not present

## 2016-11-23 DIAGNOSIS — I10 Essential (primary) hypertension: Secondary | ICD-10-CM | POA: Diagnosis not present

## 2016-11-23 DIAGNOSIS — Z96652 Presence of left artificial knee joint: Secondary | ICD-10-CM | POA: Diagnosis not present

## 2016-11-24 DIAGNOSIS — Z471 Aftercare following joint replacement surgery: Secondary | ICD-10-CM | POA: Diagnosis not present

## 2016-11-24 DIAGNOSIS — I10 Essential (primary) hypertension: Secondary | ICD-10-CM | POA: Diagnosis not present

## 2016-11-24 DIAGNOSIS — Z7982 Long term (current) use of aspirin: Secondary | ICD-10-CM | POA: Diagnosis not present

## 2016-11-24 DIAGNOSIS — Z79891 Long term (current) use of opiate analgesic: Secondary | ICD-10-CM | POA: Diagnosis not present

## 2016-11-24 DIAGNOSIS — I2510 Atherosclerotic heart disease of native coronary artery without angina pectoris: Secondary | ICD-10-CM | POA: Diagnosis not present

## 2016-11-24 DIAGNOSIS — Z96652 Presence of left artificial knee joint: Secondary | ICD-10-CM | POA: Diagnosis not present

## 2016-11-25 DIAGNOSIS — Z471 Aftercare following joint replacement surgery: Secondary | ICD-10-CM | POA: Diagnosis not present

## 2016-11-25 DIAGNOSIS — Z79891 Long term (current) use of opiate analgesic: Secondary | ICD-10-CM | POA: Diagnosis not present

## 2016-11-25 DIAGNOSIS — Z7982 Long term (current) use of aspirin: Secondary | ICD-10-CM | POA: Diagnosis not present

## 2016-11-25 DIAGNOSIS — I10 Essential (primary) hypertension: Secondary | ICD-10-CM | POA: Diagnosis not present

## 2016-11-25 DIAGNOSIS — I2510 Atherosclerotic heart disease of native coronary artery without angina pectoris: Secondary | ICD-10-CM | POA: Diagnosis not present

## 2016-11-25 DIAGNOSIS — Z96652 Presence of left artificial knee joint: Secondary | ICD-10-CM | POA: Diagnosis not present

## 2016-11-27 DIAGNOSIS — Z79891 Long term (current) use of opiate analgesic: Secondary | ICD-10-CM | POA: Diagnosis not present

## 2016-11-27 DIAGNOSIS — Z471 Aftercare following joint replacement surgery: Secondary | ICD-10-CM | POA: Diagnosis not present

## 2016-11-27 DIAGNOSIS — I10 Essential (primary) hypertension: Secondary | ICD-10-CM | POA: Diagnosis not present

## 2016-11-27 DIAGNOSIS — Z96652 Presence of left artificial knee joint: Secondary | ICD-10-CM | POA: Diagnosis not present

## 2016-11-27 DIAGNOSIS — I2510 Atherosclerotic heart disease of native coronary artery without angina pectoris: Secondary | ICD-10-CM | POA: Diagnosis not present

## 2016-11-27 DIAGNOSIS — Z7982 Long term (current) use of aspirin: Secondary | ICD-10-CM | POA: Diagnosis not present

## 2016-11-30 DIAGNOSIS — Z471 Aftercare following joint replacement surgery: Secondary | ICD-10-CM | POA: Diagnosis not present

## 2016-11-30 DIAGNOSIS — Z79891 Long term (current) use of opiate analgesic: Secondary | ICD-10-CM | POA: Diagnosis not present

## 2016-11-30 DIAGNOSIS — Z7982 Long term (current) use of aspirin: Secondary | ICD-10-CM | POA: Diagnosis not present

## 2016-11-30 DIAGNOSIS — I2510 Atherosclerotic heart disease of native coronary artery without angina pectoris: Secondary | ICD-10-CM | POA: Diagnosis not present

## 2016-11-30 DIAGNOSIS — I10 Essential (primary) hypertension: Secondary | ICD-10-CM | POA: Diagnosis not present

## 2016-11-30 DIAGNOSIS — Z96652 Presence of left artificial knee joint: Secondary | ICD-10-CM | POA: Diagnosis not present

## 2016-12-02 DIAGNOSIS — M25662 Stiffness of left knee, not elsewhere classified: Secondary | ICD-10-CM | POA: Diagnosis not present

## 2016-12-02 DIAGNOSIS — M25562 Pain in left knee: Secondary | ICD-10-CM | POA: Diagnosis not present

## 2016-12-02 DIAGNOSIS — M1712 Unilateral primary osteoarthritis, left knee: Secondary | ICD-10-CM | POA: Diagnosis not present

## 2016-12-02 DIAGNOSIS — M6281 Muscle weakness (generalized): Secondary | ICD-10-CM | POA: Diagnosis not present

## 2016-12-02 DIAGNOSIS — R2689 Other abnormalities of gait and mobility: Secondary | ICD-10-CM | POA: Diagnosis not present

## 2016-12-04 DIAGNOSIS — M25662 Stiffness of left knee, not elsewhere classified: Secondary | ICD-10-CM | POA: Diagnosis not present

## 2016-12-04 DIAGNOSIS — M1712 Unilateral primary osteoarthritis, left knee: Secondary | ICD-10-CM | POA: Diagnosis not present

## 2016-12-04 DIAGNOSIS — M6281 Muscle weakness (generalized): Secondary | ICD-10-CM | POA: Diagnosis not present

## 2016-12-04 DIAGNOSIS — R2689 Other abnormalities of gait and mobility: Secondary | ICD-10-CM | POA: Diagnosis not present

## 2016-12-04 DIAGNOSIS — M25562 Pain in left knee: Secondary | ICD-10-CM | POA: Diagnosis not present

## 2016-12-08 DIAGNOSIS — M25562 Pain in left knee: Secondary | ICD-10-CM | POA: Diagnosis not present

## 2016-12-08 DIAGNOSIS — M25662 Stiffness of left knee, not elsewhere classified: Secondary | ICD-10-CM | POA: Diagnosis not present

## 2016-12-08 DIAGNOSIS — R2689 Other abnormalities of gait and mobility: Secondary | ICD-10-CM | POA: Diagnosis not present

## 2016-12-08 DIAGNOSIS — M6281 Muscle weakness (generalized): Secondary | ICD-10-CM | POA: Diagnosis not present

## 2016-12-08 DIAGNOSIS — M1712 Unilateral primary osteoarthritis, left knee: Secondary | ICD-10-CM | POA: Diagnosis not present

## 2016-12-11 DIAGNOSIS — M25562 Pain in left knee: Secondary | ICD-10-CM | POA: Diagnosis not present

## 2016-12-11 DIAGNOSIS — M1712 Unilateral primary osteoarthritis, left knee: Secondary | ICD-10-CM | POA: Diagnosis not present

## 2016-12-11 DIAGNOSIS — R2689 Other abnormalities of gait and mobility: Secondary | ICD-10-CM | POA: Diagnosis not present

## 2016-12-11 DIAGNOSIS — M25662 Stiffness of left knee, not elsewhere classified: Secondary | ICD-10-CM | POA: Diagnosis not present

## 2016-12-11 DIAGNOSIS — M6281 Muscle weakness (generalized): Secondary | ICD-10-CM | POA: Diagnosis not present

## 2016-12-14 DIAGNOSIS — M25662 Stiffness of left knee, not elsewhere classified: Secondary | ICD-10-CM | POA: Diagnosis not present

## 2016-12-14 DIAGNOSIS — R2689 Other abnormalities of gait and mobility: Secondary | ICD-10-CM | POA: Diagnosis not present

## 2016-12-14 DIAGNOSIS — M25562 Pain in left knee: Secondary | ICD-10-CM | POA: Diagnosis not present

## 2016-12-14 DIAGNOSIS — M6281 Muscle weakness (generalized): Secondary | ICD-10-CM | POA: Diagnosis not present

## 2016-12-14 DIAGNOSIS — M1712 Unilateral primary osteoarthritis, left knee: Secondary | ICD-10-CM | POA: Diagnosis not present

## 2016-12-16 DIAGNOSIS — M25562 Pain in left knee: Secondary | ICD-10-CM | POA: Diagnosis not present

## 2016-12-16 DIAGNOSIS — M6281 Muscle weakness (generalized): Secondary | ICD-10-CM | POA: Diagnosis not present

## 2016-12-16 DIAGNOSIS — R2689 Other abnormalities of gait and mobility: Secondary | ICD-10-CM | POA: Diagnosis not present

## 2016-12-16 DIAGNOSIS — M1712 Unilateral primary osteoarthritis, left knee: Secondary | ICD-10-CM | POA: Diagnosis not present

## 2016-12-16 DIAGNOSIS — M25662 Stiffness of left knee, not elsewhere classified: Secondary | ICD-10-CM | POA: Diagnosis not present

## 2016-12-23 DIAGNOSIS — M25562 Pain in left knee: Secondary | ICD-10-CM | POA: Diagnosis not present

## 2016-12-23 DIAGNOSIS — M1712 Unilateral primary osteoarthritis, left knee: Secondary | ICD-10-CM | POA: Diagnosis not present

## 2016-12-23 DIAGNOSIS — M6281 Muscle weakness (generalized): Secondary | ICD-10-CM | POA: Diagnosis not present

## 2016-12-23 DIAGNOSIS — R2689 Other abnormalities of gait and mobility: Secondary | ICD-10-CM | POA: Diagnosis not present

## 2016-12-23 DIAGNOSIS — M25662 Stiffness of left knee, not elsewhere classified: Secondary | ICD-10-CM | POA: Diagnosis not present

## 2016-12-25 DIAGNOSIS — M25662 Stiffness of left knee, not elsewhere classified: Secondary | ICD-10-CM | POA: Diagnosis not present

## 2016-12-25 DIAGNOSIS — M1712 Unilateral primary osteoarthritis, left knee: Secondary | ICD-10-CM | POA: Diagnosis not present

## 2016-12-25 DIAGNOSIS — M25562 Pain in left knee: Secondary | ICD-10-CM | POA: Diagnosis not present

## 2016-12-25 DIAGNOSIS — R2689 Other abnormalities of gait and mobility: Secondary | ICD-10-CM | POA: Diagnosis not present

## 2016-12-25 DIAGNOSIS — M6281 Muscle weakness (generalized): Secondary | ICD-10-CM | POA: Diagnosis not present

## 2016-12-29 DIAGNOSIS — Z96652 Presence of left artificial knee joint: Secondary | ICD-10-CM | POA: Diagnosis not present

## 2016-12-29 DIAGNOSIS — M25662 Stiffness of left knee, not elsewhere classified: Secondary | ICD-10-CM | POA: Diagnosis not present

## 2016-12-29 DIAGNOSIS — R2689 Other abnormalities of gait and mobility: Secondary | ICD-10-CM | POA: Diagnosis not present

## 2016-12-29 DIAGNOSIS — M25562 Pain in left knee: Secondary | ICD-10-CM | POA: Diagnosis not present

## 2016-12-29 DIAGNOSIS — M6281 Muscle weakness (generalized): Secondary | ICD-10-CM | POA: Diagnosis not present

## 2016-12-29 DIAGNOSIS — M1712 Unilateral primary osteoarthritis, left knee: Secondary | ICD-10-CM | POA: Diagnosis not present

## 2017-01-01 DIAGNOSIS — M1712 Unilateral primary osteoarthritis, left knee: Secondary | ICD-10-CM | POA: Diagnosis not present

## 2017-01-01 DIAGNOSIS — R2689 Other abnormalities of gait and mobility: Secondary | ICD-10-CM | POA: Diagnosis not present

## 2017-01-01 DIAGNOSIS — M25662 Stiffness of left knee, not elsewhere classified: Secondary | ICD-10-CM | POA: Diagnosis not present

## 2017-01-01 DIAGNOSIS — M25562 Pain in left knee: Secondary | ICD-10-CM | POA: Diagnosis not present

## 2017-01-01 DIAGNOSIS — M6281 Muscle weakness (generalized): Secondary | ICD-10-CM | POA: Diagnosis not present

## 2017-01-05 DIAGNOSIS — R2689 Other abnormalities of gait and mobility: Secondary | ICD-10-CM | POA: Diagnosis not present

## 2017-01-05 DIAGNOSIS — M6281 Muscle weakness (generalized): Secondary | ICD-10-CM | POA: Diagnosis not present

## 2017-01-05 DIAGNOSIS — M25562 Pain in left knee: Secondary | ICD-10-CM | POA: Diagnosis not present

## 2017-01-05 DIAGNOSIS — M25662 Stiffness of left knee, not elsewhere classified: Secondary | ICD-10-CM | POA: Diagnosis not present

## 2017-01-05 DIAGNOSIS — M1712 Unilateral primary osteoarthritis, left knee: Secondary | ICD-10-CM | POA: Diagnosis not present

## 2017-01-06 DIAGNOSIS — E785 Hyperlipidemia, unspecified: Secondary | ICD-10-CM | POA: Diagnosis not present

## 2017-01-06 DIAGNOSIS — G4700 Insomnia, unspecified: Secondary | ICD-10-CM | POA: Diagnosis not present

## 2017-01-06 DIAGNOSIS — I10 Essential (primary) hypertension: Secondary | ICD-10-CM | POA: Diagnosis not present

## 2017-01-08 DIAGNOSIS — M1712 Unilateral primary osteoarthritis, left knee: Secondary | ICD-10-CM | POA: Diagnosis not present

## 2017-01-08 DIAGNOSIS — M6281 Muscle weakness (generalized): Secondary | ICD-10-CM | POA: Diagnosis not present

## 2017-01-08 DIAGNOSIS — M25562 Pain in left knee: Secondary | ICD-10-CM | POA: Diagnosis not present

## 2017-01-08 DIAGNOSIS — M25662 Stiffness of left knee, not elsewhere classified: Secondary | ICD-10-CM | POA: Diagnosis not present

## 2017-01-08 DIAGNOSIS — R2689 Other abnormalities of gait and mobility: Secondary | ICD-10-CM | POA: Diagnosis not present

## 2017-01-12 DIAGNOSIS — M1712 Unilateral primary osteoarthritis, left knee: Secondary | ICD-10-CM | POA: Diagnosis not present

## 2017-01-12 DIAGNOSIS — R2689 Other abnormalities of gait and mobility: Secondary | ICD-10-CM | POA: Diagnosis not present

## 2017-01-12 DIAGNOSIS — M6281 Muscle weakness (generalized): Secondary | ICD-10-CM | POA: Diagnosis not present

## 2017-01-12 DIAGNOSIS — M25562 Pain in left knee: Secondary | ICD-10-CM | POA: Diagnosis not present

## 2017-01-12 DIAGNOSIS — M25662 Stiffness of left knee, not elsewhere classified: Secondary | ICD-10-CM | POA: Diagnosis not present

## 2017-01-14 DIAGNOSIS — R2689 Other abnormalities of gait and mobility: Secondary | ICD-10-CM | POA: Diagnosis not present

## 2017-01-14 DIAGNOSIS — M1712 Unilateral primary osteoarthritis, left knee: Secondary | ICD-10-CM | POA: Diagnosis not present

## 2017-01-14 DIAGNOSIS — M25662 Stiffness of left knee, not elsewhere classified: Secondary | ICD-10-CM | POA: Diagnosis not present

## 2017-01-14 DIAGNOSIS — M6281 Muscle weakness (generalized): Secondary | ICD-10-CM | POA: Diagnosis not present

## 2017-01-14 DIAGNOSIS — M25562 Pain in left knee: Secondary | ICD-10-CM | POA: Diagnosis not present

## 2017-01-19 DIAGNOSIS — M1712 Unilateral primary osteoarthritis, left knee: Secondary | ICD-10-CM | POA: Diagnosis not present

## 2017-01-19 DIAGNOSIS — R2689 Other abnormalities of gait and mobility: Secondary | ICD-10-CM | POA: Diagnosis not present

## 2017-01-19 DIAGNOSIS — M6281 Muscle weakness (generalized): Secondary | ICD-10-CM | POA: Diagnosis not present

## 2017-01-19 DIAGNOSIS — M25562 Pain in left knee: Secondary | ICD-10-CM | POA: Diagnosis not present

## 2017-01-19 DIAGNOSIS — M25662 Stiffness of left knee, not elsewhere classified: Secondary | ICD-10-CM | POA: Diagnosis not present

## 2017-01-21 DIAGNOSIS — M25662 Stiffness of left knee, not elsewhere classified: Secondary | ICD-10-CM | POA: Diagnosis not present

## 2017-01-21 DIAGNOSIS — M1712 Unilateral primary osteoarthritis, left knee: Secondary | ICD-10-CM | POA: Diagnosis not present

## 2017-01-21 DIAGNOSIS — M25562 Pain in left knee: Secondary | ICD-10-CM | POA: Diagnosis not present

## 2017-01-21 DIAGNOSIS — M6281 Muscle weakness (generalized): Secondary | ICD-10-CM | POA: Diagnosis not present

## 2017-01-21 DIAGNOSIS — R2689 Other abnormalities of gait and mobility: Secondary | ICD-10-CM | POA: Diagnosis not present

## 2017-02-05 DIAGNOSIS — E785 Hyperlipidemia, unspecified: Secondary | ICD-10-CM | POA: Diagnosis not present

## 2017-02-05 DIAGNOSIS — I10 Essential (primary) hypertension: Secondary | ICD-10-CM | POA: Diagnosis not present

## 2017-02-05 DIAGNOSIS — M542 Cervicalgia: Secondary | ICD-10-CM | POA: Diagnosis not present

## 2017-02-05 DIAGNOSIS — G4700 Insomnia, unspecified: Secondary | ICD-10-CM | POA: Diagnosis not present

## 2017-02-09 DIAGNOSIS — M1712 Unilateral primary osteoarthritis, left knee: Secondary | ICD-10-CM | POA: Diagnosis not present

## 2017-02-09 DIAGNOSIS — Z96652 Presence of left artificial knee joint: Secondary | ICD-10-CM | POA: Diagnosis not present

## 2017-03-18 DIAGNOSIS — H2513 Age-related nuclear cataract, bilateral: Secondary | ICD-10-CM | POA: Diagnosis not present

## 2017-04-01 DIAGNOSIS — H25812 Combined forms of age-related cataract, left eye: Secondary | ICD-10-CM | POA: Diagnosis not present

## 2017-04-01 DIAGNOSIS — H2512 Age-related nuclear cataract, left eye: Secondary | ICD-10-CM | POA: Diagnosis not present

## 2017-04-01 DIAGNOSIS — H52202 Unspecified astigmatism, left eye: Secondary | ICD-10-CM | POA: Diagnosis not present

## 2017-04-01 DIAGNOSIS — Z01818 Encounter for other preprocedural examination: Secondary | ICD-10-CM | POA: Diagnosis not present

## 2017-04-01 DIAGNOSIS — H353131 Nonexudative age-related macular degeneration, bilateral, early dry stage: Secondary | ICD-10-CM | POA: Diagnosis not present

## 2017-04-12 DIAGNOSIS — H52221 Regular astigmatism, right eye: Secondary | ICD-10-CM | POA: Diagnosis not present

## 2017-04-12 DIAGNOSIS — H2511 Age-related nuclear cataract, right eye: Secondary | ICD-10-CM | POA: Diagnosis not present

## 2017-04-12 DIAGNOSIS — H25811 Combined forms of age-related cataract, right eye: Secondary | ICD-10-CM | POA: Diagnosis not present

## 2017-05-10 DIAGNOSIS — M542 Cervicalgia: Secondary | ICD-10-CM | POA: Diagnosis not present

## 2017-05-10 DIAGNOSIS — E785 Hyperlipidemia, unspecified: Secondary | ICD-10-CM | POA: Diagnosis not present

## 2017-05-10 DIAGNOSIS — N521 Erectile dysfunction due to diseases classified elsewhere: Secondary | ICD-10-CM | POA: Diagnosis not present

## 2017-05-10 DIAGNOSIS — I10 Essential (primary) hypertension: Secondary | ICD-10-CM | POA: Diagnosis not present

## 2017-05-10 DIAGNOSIS — Z23 Encounter for immunization: Secondary | ICD-10-CM | POA: Diagnosis not present

## 2017-06-30 DIAGNOSIS — I252 Old myocardial infarction: Secondary | ICD-10-CM | POA: Diagnosis not present

## 2017-06-30 DIAGNOSIS — Z1212 Encounter for screening for malignant neoplasm of rectum: Secondary | ICD-10-CM | POA: Diagnosis not present

## 2017-06-30 DIAGNOSIS — E785 Hyperlipidemia, unspecified: Secondary | ICD-10-CM

## 2017-06-30 DIAGNOSIS — I2510 Atherosclerotic heart disease of native coronary artery without angina pectoris: Secondary | ICD-10-CM | POA: Diagnosis not present

## 2017-06-30 DIAGNOSIS — Z7982 Long term (current) use of aspirin: Secondary | ICD-10-CM | POA: Diagnosis not present

## 2017-06-30 DIAGNOSIS — Z125 Encounter for screening for malignant neoplasm of prostate: Secondary | ICD-10-CM | POA: Diagnosis not present

## 2017-06-30 DIAGNOSIS — Z1389 Encounter for screening for other disorder: Secondary | ICD-10-CM | POA: Diagnosis not present

## 2017-06-30 DIAGNOSIS — I10 Essential (primary) hypertension: Secondary | ICD-10-CM | POA: Diagnosis not present

## 2017-06-30 HISTORY — DX: Hyperlipidemia, unspecified: E78.5

## 2017-07-29 DIAGNOSIS — L820 Inflamed seborrheic keratosis: Secondary | ICD-10-CM | POA: Diagnosis not present

## 2017-07-29 DIAGNOSIS — D1801 Hemangioma of skin and subcutaneous tissue: Secondary | ICD-10-CM | POA: Diagnosis not present

## 2017-09-29 DIAGNOSIS — E785 Hyperlipidemia, unspecified: Secondary | ICD-10-CM | POA: Diagnosis not present

## 2017-09-29 DIAGNOSIS — N521 Erectile dysfunction due to diseases classified elsewhere: Secondary | ICD-10-CM | POA: Diagnosis not present

## 2017-09-29 DIAGNOSIS — I10 Essential (primary) hypertension: Secondary | ICD-10-CM | POA: Diagnosis not present

## 2017-11-10 DIAGNOSIS — H26493 Other secondary cataract, bilateral: Secondary | ICD-10-CM | POA: Diagnosis not present

## 2017-12-31 DIAGNOSIS — I10 Essential (primary) hypertension: Secondary | ICD-10-CM | POA: Diagnosis not present

## 2017-12-31 DIAGNOSIS — I2510 Atherosclerotic heart disease of native coronary artery without angina pectoris: Secondary | ICD-10-CM | POA: Diagnosis not present

## 2017-12-31 DIAGNOSIS — E785 Hyperlipidemia, unspecified: Secondary | ICD-10-CM | POA: Diagnosis not present

## 2017-12-31 DIAGNOSIS — R196 Halitosis: Secondary | ICD-10-CM | POA: Diagnosis not present

## 2018-01-14 DIAGNOSIS — J329 Chronic sinusitis, unspecified: Secondary | ICD-10-CM | POA: Diagnosis not present

## 2018-04-04 DIAGNOSIS — E782 Mixed hyperlipidemia: Secondary | ICD-10-CM | POA: Diagnosis not present

## 2018-04-04 DIAGNOSIS — I2510 Atherosclerotic heart disease of native coronary artery without angina pectoris: Secondary | ICD-10-CM | POA: Diagnosis not present

## 2018-04-04 DIAGNOSIS — R196 Halitosis: Secondary | ICD-10-CM | POA: Diagnosis not present

## 2018-04-04 DIAGNOSIS — I10 Essential (primary) hypertension: Secondary | ICD-10-CM | POA: Diagnosis not present

## 2018-04-04 DIAGNOSIS — Z23 Encounter for immunization: Secondary | ICD-10-CM | POA: Diagnosis not present

## 2018-04-12 DIAGNOSIS — J349 Unspecified disorder of nose and nasal sinuses: Secondary | ICD-10-CM | POA: Diagnosis not present

## 2018-04-12 DIAGNOSIS — J309 Allergic rhinitis, unspecified: Secondary | ICD-10-CM | POA: Diagnosis not present

## 2018-06-14 ENCOUNTER — Other Ambulatory Visit: Payer: Self-pay

## 2018-06-28 DIAGNOSIS — R351 Nocturia: Secondary | ICD-10-CM | POA: Diagnosis not present

## 2018-06-28 DIAGNOSIS — Z1389 Encounter for screening for other disorder: Secondary | ICD-10-CM | POA: Diagnosis not present

## 2018-06-28 DIAGNOSIS — N401 Enlarged prostate with lower urinary tract symptoms: Secondary | ICD-10-CM | POA: Diagnosis not present

## 2018-06-28 DIAGNOSIS — I10 Essential (primary) hypertension: Secondary | ICD-10-CM | POA: Diagnosis not present

## 2018-06-28 DIAGNOSIS — Z6824 Body mass index (BMI) 24.0-24.9, adult: Secondary | ICD-10-CM | POA: Diagnosis not present

## 2018-06-28 DIAGNOSIS — Z1212 Encounter for screening for malignant neoplasm of rectum: Secondary | ICD-10-CM | POA: Diagnosis not present

## 2018-06-28 DIAGNOSIS — I2510 Atherosclerotic heart disease of native coronary artery without angina pectoris: Secondary | ICD-10-CM | POA: Diagnosis not present

## 2018-07-18 DIAGNOSIS — I252 Old myocardial infarction: Secondary | ICD-10-CM | POA: Diagnosis not present

## 2018-07-18 DIAGNOSIS — E785 Hyperlipidemia, unspecified: Secondary | ICD-10-CM | POA: Diagnosis not present

## 2018-07-18 DIAGNOSIS — I2510 Atherosclerotic heart disease of native coronary artery without angina pectoris: Secondary | ICD-10-CM | POA: Diagnosis not present

## 2018-07-18 DIAGNOSIS — Z789 Other specified health status: Secondary | ICD-10-CM | POA: Diagnosis not present

## 2018-09-30 DIAGNOSIS — I10 Essential (primary) hypertension: Secondary | ICD-10-CM | POA: Diagnosis not present

## 2018-09-30 DIAGNOSIS — I2510 Atherosclerotic heart disease of native coronary artery without angina pectoris: Secondary | ICD-10-CM | POA: Diagnosis not present

## 2018-09-30 DIAGNOSIS — E782 Mixed hyperlipidemia: Secondary | ICD-10-CM | POA: Diagnosis not present

## 2018-11-02 ENCOUNTER — Encounter: Payer: Self-pay | Admitting: *Deleted

## 2018-12-21 ENCOUNTER — Other Ambulatory Visit: Payer: Self-pay | Admitting: *Deleted

## 2018-12-21 NOTE — Patient Outreach (Signed)
[place] [facility]) Care Management  12/21/2018  Ruben Jackson [birth year] [id number]   Telephone assessment complete according to health risk assessment through [facility].  No needs identified, [facility] information letter, pamphlet, and magnet sent to member.  Will follow up within the next 6 months.  Valente David, [place], MSN [place] [phone]

## 2019-01-03 DIAGNOSIS — E782 Mixed hyperlipidemia: Secondary | ICD-10-CM | POA: Diagnosis not present

## 2019-01-03 DIAGNOSIS — N521 Erectile dysfunction due to diseases classified elsewhere: Secondary | ICD-10-CM | POA: Diagnosis not present

## 2019-01-03 DIAGNOSIS — I10 Essential (primary) hypertension: Secondary | ICD-10-CM | POA: Diagnosis not present

## 2019-01-03 DIAGNOSIS — I2510 Atherosclerotic heart disease of native coronary artery without angina pectoris: Secondary | ICD-10-CM | POA: Diagnosis not present

## 2019-01-23 ENCOUNTER — Other Ambulatory Visit: Payer: Self-pay | Admitting: *Deleted

## 2019-01-23 NOTE — Patient Outreach (Signed)
[place] [facility]) Care Management  01/23/2019  [place] [birth year] [id number]   Member will be followed by external care management program.  Will close case at this time, will notify primary MD of transition to external program.  Valente David, RN, MSN [place] Manager [phone]

## 2019-01-31 ENCOUNTER — Encounter: Payer: Self-pay | Admitting: Cardiology

## 2019-01-31 ENCOUNTER — Ambulatory Visit (INDEPENDENT_AMBULATORY_CARE_PROVIDER_SITE_OTHER): Payer: PPO | Admitting: Cardiology

## 2019-01-31 ENCOUNTER — Other Ambulatory Visit: Payer: Self-pay

## 2019-01-31 VITALS — BP 118/66 | HR 62 | Ht 76.0 in | Wt 199.6 lb

## 2019-01-31 DIAGNOSIS — Z789 Other specified health status: Secondary | ICD-10-CM

## 2019-01-31 DIAGNOSIS — I2510 Atherosclerotic heart disease of native coronary artery without angina pectoris: Secondary | ICD-10-CM

## 2019-01-31 DIAGNOSIS — E785 Hyperlipidemia, unspecified: Secondary | ICD-10-CM

## 2019-01-31 NOTE — Progress Notes (Signed)
Cardiology Office Note:    Date:  01/31/2019   ID:  [name], DOB [birth year], MRN [id number]  PCP:  Garwin Brothers, MD  Cardiologist:  Jenean Lindau, MD   Referring MD: Garwin Brothers, MD    ASSESSMENT:    1. Coronary artery disease involving native coronary artery of native heart without angina pectoris   2. Statin intolerance   3. Dyslipidemia    PLAN:    In order of problems listed above:  1. Coronary artery disease: Secondary prevention stressed with the patient.  Importance of compliance with diet and medication stressed and he vocalized understanding.  His blood pressure is stable.  I told him to exercise and walk at least half an hour a day at least 5 days a week and he promises to do so. 2. Essential hypertension: Blood pressure stable.  Diet was discussed 3. Mixed dyslipidemia: He is intolerant to statins and is taking injectable lipid-lowering therapy and lipids are followed by his primary care physician.  He is tolerating this therapy well.  He does have nitroglycerin with him and knows its use 4. Patient will be seen in follow-up appointment in 6 months or earlier if the patient has any concerns    Medication Adjustments/Labs and Tests Ordered: Current medicines are reviewed at length with the patient today.  Concerns regarding medicines are outlined above.  No orders of the defined types were placed in this encounter.  No orders of the defined types were placed in this encounter.    No chief complaint on file.    History of Present Illness:    Ruben Jackson is a 68 y.o. male.  Patient has past medical history of coronary artery disease post stenting, essential hypertension and dyslipidemia.  He is an active gentleman but does not exercise on a regular basis.  No chest pain orthopnea or PND.  He is here to be established.  At the time of my evaluation, the patient is alert awake oriented and in no distress.  Past Medical History:  Diagnosis Date  . CAD  (coronary artery disease)   . Hyperlipidemia   . Hypertension   . Myocardial infarction [facility])     Past Surgical History:  Procedure Laterality Date  . HIP ARTHROSCOPY W/ LABRAL REPAIR    . REPLACEMENT TOTAL KNEE      Current Medications: Current Meds  Medication Sig  . aspirin EC 81 MG tablet Take 81 mg by mouth daily.  . Coenzyme Q10 (CO Q-10) 200 MG CAPS Take 1 capsule by mouth daily.  Marland Kitchen ezetimibe (ZETIA) 10 MG tablet Take 1 tablet by mouth daily.  . flurbiprofen (ANSAID) 100 MG tablet Take 1 tablet by mouth as needed.  Marland Kitchen losartan (COZAAR) 25 MG tablet Take 1 tablet by mouth daily.  . metoprolol tartrate (LOPRESSOR) 50 MG tablet Take 1 tablet by mouth daily.  Marland Kitchen REPATHA SURECLICK 938 MG/ML SOAJ      Allergies:   Lisinopril and Penicillins   Social History   Socioeconomic History  . Marital status: Married    Spouse name: Not on file  . Number of children: Not on file  . Years of education: Not on file  . Highest education level: Not on file  Occupational History  . Not on file  Social Needs  . Financial resource strain: Not on file  . Food insecurity    Worry: Not on file    Inability: Not on file  . Transportation needs  Medical: Not on file    Non-medical: Not on file  Tobacco Use  . Smoking status: Never Smoker  . Smokeless tobacco: Never Used  Substance and Sexual Activity  . Alcohol use: Not on file  . Drug use: Not on file  . Sexual activity: Not on file  Lifestyle  . Physical activity    Days per week: Not on file    Minutes per session: Not on file  . Stress: Not on file  Relationships  . Social Herbalist on phone: Not on file    Gets together: Not on file    Attends religious service: Not on file    Active member of club or organization: Not on file    Attends meetings of clubs or organizations: Not on file    Relationship status: Not on file  Other Topics Concern  . Not on file  Social History Narrative  . Not on file      Family History: The patient's family history is not on file.  ROS:   Please see the history of present illness.    All other systems reviewed and are negative.  EKGs/Labs/Other Studies Reviewed:    The following studies were reviewed today: EKG reveals sinus rhythm and nonspecific ST-T changes.  Records from other cardiology practice were reviewed.   Recent Labs: No results found for requested labs within last 8760 hours.  Recent Lipid Panel No results found for: CHOL, TRIG, HDL, CHOLHDL, VLDL, LDLCALC, LDLDIRECT  Physical Exam:    VS:  BP 118/66 (BP Location: Left Arm, Patient Position: Sitting, Cuff Size: Normal)   Pulse 62   Ht 6\' 4"  (1.93 m)   Wt 199 lb 9.6 oz (90.5 kg)   SpO2 98%   BMI 24.30 kg/m     Wt Readings from Last 3 Encounters:  01/31/19 199 lb 9.6 oz (90.5 kg)     GEN: Patient is in no acute distress HEENT: Normal NECK: No JVD; No carotid bruits LYMPHATICS: No lymphadenopathy CARDIAC: Hear sounds regular, 2/6 systolic murmur at the apex. RESPIRATORY:  Clear to auscultation without rales, wheezing or rhonchi  ABDOMEN: Soft, non-tender, non-distended MUSCULOSKELETAL:  No edema; No deformity  SKIN: Warm and dry NEUROLOGIC:  Alert and oriented x 3 PSYCHIATRIC:  Normal affect   Signed, Jenean Lindau, MD  01/31/2019 3:50 PM    [facility]

## 2019-01-31 NOTE — Patient Instructions (Signed)

## 2019-03-21 DIAGNOSIS — K5900 Constipation, unspecified: Secondary | ICD-10-CM | POA: Diagnosis not present

## 2019-03-21 DIAGNOSIS — K625 Hemorrhage of anus and rectum: Secondary | ICD-10-CM | POA: Diagnosis not present

## 2019-03-21 DIAGNOSIS — R1031 Right lower quadrant pain: Secondary | ICD-10-CM | POA: Diagnosis not present

## 2019-03-21 DIAGNOSIS — Z1211 Encounter for screening for malignant neoplasm of colon: Secondary | ICD-10-CM | POA: Diagnosis not present

## 2019-03-24 DIAGNOSIS — K625 Hemorrhage of anus and rectum: Secondary | ICD-10-CM | POA: Diagnosis not present

## 2019-03-24 DIAGNOSIS — R1031 Right lower quadrant pain: Secondary | ICD-10-CM | POA: Diagnosis not present

## 2019-03-24 DIAGNOSIS — Z1211 Encounter for screening for malignant neoplasm of colon: Secondary | ICD-10-CM | POA: Diagnosis not present

## 2019-03-24 DIAGNOSIS — K5730 Diverticulosis of large intestine without perforation or abscess without bleeding: Secondary | ICD-10-CM | POA: Diagnosis not present

## 2019-04-07 DIAGNOSIS — I10 Essential (primary) hypertension: Secondary | ICD-10-CM | POA: Diagnosis not present

## 2019-04-07 DIAGNOSIS — I2510 Atherosclerotic heart disease of native coronary artery without angina pectoris: Secondary | ICD-10-CM | POA: Diagnosis not present

## 2019-04-07 DIAGNOSIS — N521 Erectile dysfunction due to diseases classified elsewhere: Secondary | ICD-10-CM | POA: Diagnosis not present

## 2019-04-07 DIAGNOSIS — E782 Mixed hyperlipidemia: Secondary | ICD-10-CM | POA: Diagnosis not present

## 2019-04-07 DIAGNOSIS — Z23 Encounter for immunization: Secondary | ICD-10-CM | POA: Diagnosis not present

## 2019-04-27 ENCOUNTER — Ambulatory Visit: Payer: Self-pay | Admitting: *Deleted

## 2019-07-03 DIAGNOSIS — Z1389 Encounter for screening for other disorder: Secondary | ICD-10-CM | POA: Diagnosis not present

## 2019-07-03 DIAGNOSIS — Z0000 Encounter for general adult medical examination without abnormal findings: Secondary | ICD-10-CM | POA: Diagnosis not present

## 2019-07-03 DIAGNOSIS — Z1211 Encounter for screening for malignant neoplasm of colon: Secondary | ICD-10-CM | POA: Diagnosis not present

## 2019-07-03 DIAGNOSIS — I2510 Atherosclerotic heart disease of native coronary artery without angina pectoris: Secondary | ICD-10-CM | POA: Diagnosis not present

## 2019-07-03 DIAGNOSIS — E782 Mixed hyperlipidemia: Secondary | ICD-10-CM | POA: Diagnosis not present

## 2019-07-03 DIAGNOSIS — I10 Essential (primary) hypertension: Secondary | ICD-10-CM | POA: Diagnosis not present

## 2019-07-03 DIAGNOSIS — Z125 Encounter for screening for malignant neoplasm of prostate: Secondary | ICD-10-CM | POA: Diagnosis not present

## 2019-07-03 DIAGNOSIS — Z6823 Body mass index (BMI) 23.0-23.9, adult: Secondary | ICD-10-CM | POA: Diagnosis not present

## 2019-07-13 DIAGNOSIS — M67431 Ganglion, right wrist: Secondary | ICD-10-CM | POA: Diagnosis not present

## 2019-08-04 ENCOUNTER — Ambulatory Visit: Payer: PPO | Admitting: Cardiology

## 2019-08-08 ENCOUNTER — Ambulatory Visit (INDEPENDENT_AMBULATORY_CARE_PROVIDER_SITE_OTHER): Payer: PPO | Admitting: Cardiology

## 2019-08-08 ENCOUNTER — Encounter: Payer: Self-pay | Admitting: Cardiology

## 2019-08-08 ENCOUNTER — Other Ambulatory Visit: Payer: Self-pay

## 2019-08-08 VITALS — BP 128/88 | HR 63 | Ht 76.0 in | Wt 201.0 lb

## 2019-08-08 DIAGNOSIS — Z789 Other specified health status: Secondary | ICD-10-CM

## 2019-08-08 DIAGNOSIS — E785 Hyperlipidemia, unspecified: Secondary | ICD-10-CM | POA: Diagnosis not present

## 2019-08-08 DIAGNOSIS — I2510 Atherosclerotic heart disease of native coronary artery without angina pectoris: Secondary | ICD-10-CM

## 2019-08-08 DIAGNOSIS — T466X5A Adverse effect of antihyperlipidemic and antiarteriosclerotic drugs, initial encounter: Secondary | ICD-10-CM

## 2019-08-08 DIAGNOSIS — G720 Drug-induced myopathy: Secondary | ICD-10-CM

## 2019-08-08 NOTE — Progress Notes (Signed)
Cardiology Office Note:    Date:  08/08/2019   ID:  Ruben Jackson, DOB Dec 13, 1950, MRN [id number]  PCP:  Garwin Brothers, MD  Cardiologist:  Jenean Lindau, MD   Referring MD: Garwin Brothers, MD    ASSESSMENT:    1. Coronary artery disease involving native coronary artery of native heart without angina pectoris   2. Statin intolerance   3. Dyslipidemia    PLAN:    In order of problems listed above:  1. Coronary artery disease: Secondary prevention stressed with the patient.  Importance of compliance with diet and medication stressed and he vocalized understanding. 2. Essential hypertension: Blood pressure is stable 3. Mixed dyslipidemia: On injectable lipid-lowering therapy.  He says his blood work was done recently and he was told that it was fine by his primary care physician.  We will get a copy of these labs and review them 4. Patient will be seen in follow-up appointment in 6 months or earlier if the patient has any concerns    Medication Adjustments/Labs and Tests Ordered: Current medicines are reviewed at length with the patient today.  Concerns regarding medicines are outlined above.  No orders of the defined types were placed in this encounter.  No orders of the defined types were placed in this encounter.    Chief Complaint  Patient presents with  . Follow-up     History of Present Illness:    Ruben Jackson is a 69 y.o. male.  Patient has past medical history of coronary artery disease, essential hypertension and dyslipidemia.  He is taking injectable lipid-lowering therapy and is happy with it.  His blood work was recently checked by his primary care physician.  He denies any chest pain orthopnea or PND.  At the time of my evaluation, the patient is alert awake oriented and in no distress.  Past Medical History:  Diagnosis Date  . CAD (coronary artery disease)   . Hyperlipidemia   . Hypertension   . Myocardial infarction [facility])     Past Surgical History:    Procedure Laterality Date  . HIP ARTHROSCOPY W/ LABRAL REPAIR    . REPLACEMENT TOTAL KNEE      Current Medications: Current Meds  Medication Sig  . aspirin EC 81 MG tablet Take 81 mg by mouth daily.  . Coenzyme Q10 (CO Q-10) 200 MG CAPS Take 1 capsule by mouth daily.  Marland Kitchen ezetimibe (ZETIA) 10 MG tablet Take 1 tablet by mouth daily.  . flurbiprofen (ANSAID) 100 MG tablet Take 1 tablet by mouth as needed.  Marland Kitchen losartan (COZAAR) 25 MG tablet Take 1 tablet by mouth daily.  . metoprolol tartrate (LOPRESSOR) 50 MG tablet Take 1 tablet by mouth daily.  . nitroGLYCERIN (NITROSTAT) 0.4 MG SL tablet Place under the tongue.  Marland Kitchen REPATHA SURECLICK [id number] MG/ML SOAJ   . sildenafil (REVATIO) 20 MG tablet Take 20 mg by mouth as needed.     Allergies:   Lisinopril and Penicillins   Social History   Socioeconomic History  . Marital status: Married    Spouse name: Not on file  . Number of children: Not on file  . Years of education: Not on file  . Highest education level: Not on file  Occupational History  . Not on file  Tobacco Use  . Smoking status: Never Smoker  . Smokeless tobacco: Never Used  Substance and Sexual Activity  . Alcohol use: Not on file  . Drug use: Not on file  . Sexual  activity: Not on file  Other Topics Concern  . Not on file  Social History Narrative  . Not on file   Social Determinants of Health   Financial Resource Strain:   . Difficulty of Paying Living Expenses: Not on file  Food Insecurity:   . Worried About Charity fundraiser in the Last Year: Not on file  . Ran Out of Food in the Last Year: Not on file  Transportation Needs:   . Lack of Transportation (Medical): Not on file  . Lack of Transportation (Non-Medical): Not on file  Physical Activity:   . Days of Exercise per Week: Not on file  . Minutes of Exercise per Session: Not on file  Stress:   . Feeling of Stress : Not on file  Social Connections:   . Frequency of Communication with Friends and  Family: Not on file  . Frequency of Social Gatherings with Friends and Family: Not on file  . Attends Religious Services: Not on file  . Active Member of Clubs or Organizations: Not on file  . Attends Archivist Meetings: Not on file  . Marital Status: Not on file     Family History: The patient's family history is not on file.  ROS:   Please see the history of present illness.    All other systems reviewed and are negative.  EKGs/Labs/Other Studies Reviewed:    The following studies were reviewed today: I discussed my findings with the patient at length   Recent Labs: No results found for requested labs within last 8760 hours.  Recent Lipid Panel No results found for: CHOL, TRIG, HDL, CHOLHDL, VLDL, LDLCALC, LDLDIRECT  Physical Exam:    VS:  BP 128/88 (BP Location: Left Arm, Patient Position: Sitting, Cuff Size: Normal)   Pulse 63   Ht 6\' 4"  (1.93 m)   Wt 201 lb (91.2 kg)   SpO2 97%   BMI 24.47 kg/m     Wt Readings from Last 3 Encounters:  08/08/19 201 lb (91.2 kg)  01/31/19 199 lb 9.6 oz (90.5 kg)     GEN: Patient is in no acute distress HEENT: Normal NECK: No JVD; No carotid bruits LYMPHATICS: No lymphadenopathy CARDIAC: Hear sounds regular, 2/6 systolic murmur at the apex. RESPIRATORY:  Clear to auscultation without rales, wheezing or rhonchi  ABDOMEN: Soft, non-tender, non-distended MUSCULOSKELETAL:  No edema; No deformity  SKIN: Warm and dry NEUROLOGIC:  Alert and oriented x 3 PSYCHIATRIC:  Normal affect   Signed, Jenean Lindau, MD  08/08/2019 10:06 AM    [place]

## 2019-08-08 NOTE — Patient Instructions (Signed)

## 2019-08-10 ENCOUNTER — Telehealth: Payer: Self-pay | Admitting: Cardiology

## 2019-08-10 NOTE — Telephone Encounter (Signed)
Wants her husband to get covid vaccine but needs a paper signed about him being on blood thinners??

## 2019-08-11 NOTE — Telephone Encounter (Signed)
Patient can get the Covid vaccine when his time comes.  There is no issue with any blood thinners.  We do not give out any paperwork.

## 2019-08-11 NOTE — Telephone Encounter (Signed)
Telephone call to patient. Told him Dr Geraldo Pitter said it was OK to get the Covid vaccine.

## 2019-10-02 DIAGNOSIS — L309 Dermatitis, unspecified: Secondary | ICD-10-CM | POA: Diagnosis not present

## 2019-10-02 DIAGNOSIS — E782 Mixed hyperlipidemia: Secondary | ICD-10-CM | POA: Diagnosis not present

## 2019-10-02 DIAGNOSIS — I10 Essential (primary) hypertension: Secondary | ICD-10-CM | POA: Diagnosis not present

## 2019-10-02 DIAGNOSIS — N529 Male erectile dysfunction, unspecified: Secondary | ICD-10-CM | POA: Diagnosis not present

## 2020-01-03 DIAGNOSIS — E782 Mixed hyperlipidemia: Secondary | ICD-10-CM | POA: Diagnosis not present

## 2020-01-03 DIAGNOSIS — N529 Male erectile dysfunction, unspecified: Secondary | ICD-10-CM | POA: Diagnosis not present

## 2020-01-03 DIAGNOSIS — I2510 Atherosclerotic heart disease of native coronary artery without angina pectoris: Secondary | ICD-10-CM | POA: Diagnosis not present

## 2020-01-03 DIAGNOSIS — I10 Essential (primary) hypertension: Secondary | ICD-10-CM | POA: Diagnosis not present

## 2020-03-07 ENCOUNTER — Ambulatory Visit: Payer: PPO | Admitting: Cardiology

## 2020-03-07 ENCOUNTER — Other Ambulatory Visit: Payer: Self-pay

## 2020-03-07 ENCOUNTER — Encounter: Payer: Self-pay | Admitting: Cardiology

## 2020-03-07 VITALS — BP 118/70 | HR 56 | Ht 77.5 in | Wt 198.8 lb

## 2020-03-07 DIAGNOSIS — E785 Hyperlipidemia, unspecified: Secondary | ICD-10-CM | POA: Diagnosis not present

## 2020-03-07 DIAGNOSIS — I2510 Atherosclerotic heart disease of native coronary artery without angina pectoris: Secondary | ICD-10-CM | POA: Diagnosis not present

## 2020-03-07 DIAGNOSIS — E782 Mixed hyperlipidemia: Secondary | ICD-10-CM

## 2020-03-07 DIAGNOSIS — Z789 Other specified health status: Secondary | ICD-10-CM | POA: Diagnosis not present

## 2020-03-07 MED ORDER — NITROGLYCERIN 0.4 MG SL SUBL: 0.4000 mg | SUBLINGUAL_TABLET | SUBLINGUAL | 12 refills | Status: DC | PRN

## 2020-03-07 NOTE — Progress Notes (Signed)
Cardiology Office Note:    Date:  03/07/2020   ID:  Ruben Jackson, DOB 04/25/1951, MRN [id number]  PCP:  Garwin Brothers, MD  Cardiologist:  Jenean Lindau, MD   Referring MD: Garwin Brothers, MD    ASSESSMENT:    1. Coronary artery disease involving native coronary artery of native heart without angina pectoris   2. Mixed hyperlipidemia   3. Dyslipidemia   4. Statin intolerance    PLAN:    In order of problems listed above:  1. Coronary artery disease: Secondary prevention stressed with the patient. Importance of compliance with diet medication stressed and he vocalized understanding. I told him to walk at least half an hour a day 5 days a week. Essential hypertension: Blood pressure is stable. Mixed dyslipidemia: Lipids are followed by primary care physician. He tells me that he gets blood work done every 6 months and his lipids are fine. He is on Repatha. Patient will be seen in follow-up appointment in 6 months or earlier if the patient has any concerns    Medication Adjustments/Labs and Tests Ordered: Current medicines are reviewed at length with the patient today.  Concerns regarding medicines are outlined above.  No orders of the defined types were placed in this encounter.  No orders of the defined types were placed in this encounter.    No chief complaint on file.    History of Present Illness:    Ruben Jackson is a 69 y.o. male. Patient has past medical history of coronary artery disease, essential hypertension and dyslipidemia. He has statin intolerance. He denies any problems at this time and takes care of activities of daily living. No chest pain orthopnea or PND. At the time of my evaluation, the patient is alert awake oriented and in no distress. He walks on a regular basis.  Past Medical History:  Diagnosis Date  . CAD (coronary artery disease)   . Hyperlipidemia   . Hypertension   . Myocardial infarction [facility])     Past Surgical History:  Procedure  Laterality Date  . HIP ARTHROSCOPY W/ LABRAL REPAIR    . REPLACEMENT TOTAL KNEE      Current Medications: Current Meds  Medication Sig  . aspirin EC 81 MG tablet Take 81 mg by mouth daily.  . Coenzyme Q10 (CO Q-10) 200 MG CAPS Take 1 capsule by mouth daily.  Marland Kitchen ezetimibe (ZETIA) 10 MG tablet Take 1 tablet by mouth daily.  . flurbiprofen (ANSAID) 100 MG tablet Take 1 tablet by mouth as needed.  Marland Kitchen losartan (COZAAR) 25 MG tablet Take 1 tablet by mouth daily.  . metoprolol tartrate (LOPRESSOR) 50 MG tablet Take 1 tablet by mouth daily.  . nitroGLYCERIN (NITROSTAT) 0.4 MG SL tablet Place under the tongue.  Marland Kitchen REPATHA SURECLICK 333 MG/ML SOAJ Inject 140 mg into the skin every 14 (fourteen) days.      Allergies:   Lisinopril and Penicillins   Social History   Socioeconomic History  . Marital status: Married    Spouse name: Not on file  . Number of children: Not on file  . Years of education: Not on file  . Highest education level: Not on file  Occupational History  . Not on file  Tobacco Use  . Smoking status: Never Smoker  . Smokeless tobacco: Never Used  Substance and Sexual Activity  . Alcohol use: Not on file  . Drug use: Not on file  . Sexual activity: Not on file  Other Topics Concern  .  Not on file  Social History Narrative  . Not on file   Social Determinants of Health   Financial Resource Strain:   . Difficulty of Paying Living Expenses:   Food Insecurity:   . Worried About Charity fundraiser in the Last Year:   . Arboriculturist in the Last Year:   Transportation Needs:   . Film/video editor (Medical):   Marland Kitchen Lack of Transportation (Non-Medical):   Physical Activity:   . Days of Exercise per Week:   . Minutes of Exercise per Session:   Stress:   . Feeling of Stress :   Social Connections:   . Frequency of Communication with Friends and Family:   . Frequency of Social Gatherings with Friends and Family:   . Attends Religious Services:   . Active Member  of Clubs or Organizations:   . Attends Archivist Meetings:   Marland Kitchen Marital Status:      Family History: The patient's family history is not on file.  ROS:   Please see the history of present illness.    All other systems reviewed and are negative.  EKGs/Labs/Other Studies Reviewed:    The following studies were reviewed today: EKG reveals sinus rhythm and nonspecific ST-T changes. I discussed my findings with the patient at length   Recent Labs: No results found for requested labs within last 8760 hours.  Recent Lipid Panel No results found for: CHOL, TRIG, HDL, CHOLHDL, VLDL, LDLCALC, LDLDIRECT  Physical Exam:    VS:  BP 118/70   Pulse (!) 56   Ht 6' 5.5" (1.969 m)   Wt 198 lb 12.8 oz (90.2 kg)   SpO2 96%   BMI 23.27 kg/m     Wt Readings from Last 3 Encounters:  03/07/20 198 lb 12.8 oz (90.2 kg)  08/08/19 201 lb (91.2 kg)  01/31/19 199 lb 9.6 oz (90.5 kg)     GEN: Patient is in no acute distress HEENT: Normal NECK: No JVD; No carotid bruits LYMPHATICS: No lymphadenopathy CARDIAC: Hear sounds regular, 2/6 systolic murmur at the apex. RESPIRATORY:  Clear to auscultation without rales, wheezing or rhonchi  ABDOMEN: Soft, non-tender, non-distended MUSCULOSKELETAL:  No edema; No deformity  SKIN: Warm and dry NEUROLOGIC:  Alert and oriented x 3 PSYCHIATRIC:  Normal affect   Signed, Jenean Lindau, MD  03/07/2020 1:17 PM    [facility]

## 2020-03-07 NOTE — Patient Instructions (Signed)

## 2020-04-05 DIAGNOSIS — Z23 Encounter for immunization: Secondary | ICD-10-CM | POA: Diagnosis not present

## 2020-04-05 DIAGNOSIS — E782 Mixed hyperlipidemia: Secondary | ICD-10-CM | POA: Diagnosis not present

## 2020-04-05 DIAGNOSIS — N529 Male erectile dysfunction, unspecified: Secondary | ICD-10-CM | POA: Diagnosis not present

## 2020-04-05 DIAGNOSIS — I10 Essential (primary) hypertension: Secondary | ICD-10-CM | POA: Diagnosis not present

## 2020-04-05 DIAGNOSIS — I2510 Atherosclerotic heart disease of native coronary artery without angina pectoris: Secondary | ICD-10-CM | POA: Diagnosis not present

## 2020-05-17 DIAGNOSIS — T466X5A Adverse effect of antihyperlipidemic and antiarteriosclerotic drugs, initial encounter: Secondary | ICD-10-CM | POA: Insufficient documentation

## 2020-05-17 DIAGNOSIS — G720 Drug-induced myopathy: Secondary | ICD-10-CM | POA: Insufficient documentation

## 2020-05-17 HISTORY — DX: Drug-induced myopathy: G72.0

## 2020-07-08 DIAGNOSIS — E782 Mixed hyperlipidemia: Secondary | ICD-10-CM | POA: Diagnosis not present

## 2020-07-08 DIAGNOSIS — Z6823 Body mass index (BMI) 23.0-23.9, adult: Secondary | ICD-10-CM | POA: Diagnosis not present

## 2020-07-08 DIAGNOSIS — Z1389 Encounter for screening for other disorder: Secondary | ICD-10-CM | POA: Diagnosis not present

## 2020-07-08 DIAGNOSIS — Z0000 Encounter for general adult medical examination without abnormal findings: Secondary | ICD-10-CM | POA: Diagnosis not present

## 2020-07-08 DIAGNOSIS — I10 Essential (primary) hypertension: Secondary | ICD-10-CM | POA: Diagnosis not present

## 2020-07-15 DIAGNOSIS — Z131 Encounter for screening for diabetes mellitus: Secondary | ICD-10-CM | POA: Diagnosis not present

## 2020-07-15 DIAGNOSIS — I10 Essential (primary) hypertension: Secondary | ICD-10-CM | POA: Diagnosis not present

## 2020-07-15 DIAGNOSIS — E785 Hyperlipidemia, unspecified: Secondary | ICD-10-CM | POA: Diagnosis not present

## 2020-07-15 DIAGNOSIS — Z125 Encounter for screening for malignant neoplasm of prostate: Secondary | ICD-10-CM | POA: Diagnosis not present

## 2020-09-06 DIAGNOSIS — I10 Essential (primary) hypertension: Secondary | ICD-10-CM | POA: Insufficient documentation

## 2020-09-06 DIAGNOSIS — I2510 Atherosclerotic heart disease of native coronary artery without angina pectoris: Secondary | ICD-10-CM | POA: Insufficient documentation

## 2020-09-06 DIAGNOSIS — I219 Acute myocardial infarction, unspecified: Secondary | ICD-10-CM | POA: Insufficient documentation

## 2020-09-06 DIAGNOSIS — E785 Hyperlipidemia, unspecified: Secondary | ICD-10-CM | POA: Insufficient documentation

## 2020-09-09 ENCOUNTER — Encounter: Payer: Self-pay | Admitting: Cardiology

## 2020-09-09 ENCOUNTER — Ambulatory Visit: Payer: PPO | Admitting: Cardiology

## 2020-09-09 ENCOUNTER — Other Ambulatory Visit: Payer: Self-pay

## 2020-09-09 VITALS — BP 122/60 | HR 66 | Ht 76.0 in | Wt 196.0 lb

## 2020-09-09 DIAGNOSIS — I10 Essential (primary) hypertension: Secondary | ICD-10-CM | POA: Diagnosis not present

## 2020-09-09 DIAGNOSIS — E782 Mixed hyperlipidemia: Secondary | ICD-10-CM

## 2020-09-09 DIAGNOSIS — I2510 Atherosclerotic heart disease of native coronary artery without angina pectoris: Secondary | ICD-10-CM

## 2020-09-09 DIAGNOSIS — Z789 Other specified health status: Secondary | ICD-10-CM | POA: Diagnosis not present

## 2020-09-09 DIAGNOSIS — E785 Hyperlipidemia, unspecified: Secondary | ICD-10-CM

## 2020-09-09 NOTE — Patient Instructions (Signed)

## 2020-09-09 NOTE — Progress Notes (Signed)
Cardiology Office Note:    Date:  09/09/2020   ID:  Ruben Jackson, DOB June 16, 1951, MRN [id number]  PCP:  Garwin Brothers, MD  Cardiologist:  Jenean Lindau, MD   Referring MD: Garwin Brothers, MD    ASSESSMENT:    1. Dyslipidemia   2. Coronary artery disease involving native coronary artery of native heart without angina pectoris   3. Mixed hyperlipidemia   4. Primary hypertension   5. Statin intolerance    PLAN:    In order of problems listed above:  1. Coronary disease: Secondary prevention stressed with the patient.  Importance of compliance with diet medication stressed any vocalized understanding.  He has good effort tolerance and I told him to walk at least half an hour a day 5 days a week and he promises to do so. 2. Essential hypertension: Blood pressure stable and diet was emphasized 3. Mixed dyslipidemia and statin intolerance: Patient is taking Zetia and Repatha and I will see him back in the next few days for fasting liver lipid check.  Diet was emphasized. 4. Patient will be seen in follow-up appointment in 6 months or earlier if the patient has any concerns    Medication Adjustments/Labs and Tests Ordered: Current medicines are reviewed at length with the patient today.  Concerns regarding medicines are outlined above.  Orders Placed This Encounter  Procedures  . Basic metabolic panel  . Hepatic function panel  . Lipid panel   No orders of the defined types were placed in this encounter.    No chief complaint on file.    History of Present Illness:    Ruben Jackson is a 70 y.o. male.  Patient has past medical history of coronary artery disease, essential hypertension and dyslipidemia.  She denies any problems at this time and takes care of activities of daily living.  No chest pain orthopnea or PND.  He walks on a regular basis and has good effort tolerance.  At the time of my evaluation, the patient is alert awake oriented and in no distress.   Past Medical  History:  Diagnosis Date  . CAD (coronary artery disease)   . Coronary artery disease involving native coronary artery of native heart without angina pectoris 02/10/2016  . Dyslipidemia 06/30/2017  . Hyperlipidemia   . Hypertension   . Muscle weakness (generalized) 02/11/2016  . Myocardial infarction ([facility])   . 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 Statin intolerance 02/11/2016  . Statin myopathy 05/17/2020    Past Surgical History:  Procedure Laterality Date  . HIP ARTHROSCOPY W/ LABRAL REPAIR    . REPLACEMENT TOTAL KNEE      Current Medications: Current Meds  Medication Sig  . aspirin EC 81 MG tablet Take 81 mg by mouth daily.  . Coenzyme Q10 (CO Q-10) 200 MG CAPS Take 1 capsule by mouth daily.  Marland Kitchen ezetimibe (ZETIA) 10 MG tablet Take 1 tablet by mouth daily.  . flurbiprofen (ANSAID) 100 MG tablet Take 1 tablet by mouth daily.  Marland Kitchen losartan (COZAAR) 25 MG tablet Take 1 tablet by mouth daily.  . metoprolol tartrate (LOPRESSOR) 50 MG tablet Take 1 tablet by mouth daily.  . nitroGLYCERIN (NITROSTAT) 0.4 MG SL tablet Place 1 tablet (0.4 mg total) under the tongue every 5 (five) minutes as needed for chest pain.  Marland Kitchen REPATHA SURECLICK 976 MG/ML SOAJ Inject 140 mg into the skin every 14 (fourteen) days.   . sildenafil (REVATIO) 20 MG tablet Take 20 mg by  mouth as needed (ED).     Allergies:   Lisinopril and Penicillins   Social History   Socioeconomic History  . Marital status: Married    Spouse name: Not on file  . Number of children: Not on file  . Years of education: Not on file  . Highest education level: Not on file  Occupational History  . Not on file  Tobacco Use  . Smoking status: Never Smoker  . Smokeless tobacco: Never Used  Substance and Sexual Activity  . Alcohol use: Not on file  . Drug use: Not on file  . Sexual activity: Not on file  Other Topics Concern  . Not on file  Social History Narrative  . Not on file   Social Determinants of Health    Financial Resource Strain: Not on file  Food Insecurity: Not on file  Transportation Needs: Not on file  Physical Activity: Not on file  Stress: Not on file  Social Connections: Not on file     Family History: The patient's family history includes Hypertension in his mother.  ROS:   Please see the history of present illness.    All other systems reviewed and are negative.  EKGs/Labs/Other Studies Reviewed:    The following studies were reviewed today: I discussed my findings with the patient at length   Recent Labs: No results found for requested labs within last 8760 hours.  Recent Lipid Panel No results found for: CHOL, TRIG, HDL, CHOLHDL, VLDL, LDLCALC, LDLDIRECT  Physical Exam:    VS:  BP 122/60   Pulse 66   Ht 6\' 4"  (1.93 m)   Wt 196 lb (88.9 kg)   SpO2 99%   BMI 23.86 kg/m     Wt Readings from Last 3 Encounters:  09/09/20 196 lb (88.9 kg)  03/07/20 198 lb 12.8 oz (90.2 kg)  08/08/19 201 lb (91.2 kg)     GEN: Patient is in no acute distress HEENT: Normal NECK: No JVD; No carotid bruits LYMPHATICS: No lymphadenopathy CARDIAC: Hear sounds regular, 2/6 systolic murmur at the apex. RESPIRATORY:  Clear to auscultation without rales, wheezing or rhonchi  ABDOMEN: Soft, non-tender, non-distended MUSCULOSKELETAL:  No edema; No deformity  SKIN: Warm and dry NEUROLOGIC:  Alert and oriented x 3 PSYCHIATRIC:  Normal affect   Signed, Jenean Lindau, MD  09/09/2020 1:28 PM     [facility]

## 2020-09-10 DIAGNOSIS — E785 Hyperlipidemia, unspecified: Secondary | ICD-10-CM | POA: Diagnosis not present

## 2020-09-10 LAB — BASIC METABOLIC PANEL
BUN/Creatinine Ratio: 16 (ref 10–24)
BUN: 14 mg/dL (ref 8–27)
CO2: 25 mmol/L (ref 20–29)
Calcium: 9 mg/dL (ref 8.6–10.2)
Chloride: 104 mmol/L (ref 96–106)
Creatinine, Ser: 0.85 mg/dL (ref 0.76–1.27)
GFR calc Af Amer: 101 mL/min/{1.73_m2} (ref >59)
GFR calc non Af Amer: 88 mL/min/{1.73_m2} (ref >59)
Glucose: 104 mg/dL — ABNORMAL HIGH (ref 65–99)
Potassium: 4.1 mmol/L (ref 3.5–5.2)
Sodium: 141 mmol/L (ref 134–144)

## 2020-09-10 LAB — LIPID PANEL
Chol/HDL Ratio: 2.3 ratio (ref 0.0–5.0)
Cholesterol, Total: 153 mg/dL (ref 100–199)
HDL: 66 mg/dL (ref >39)
LDL Chol Calc (NIH): 76 mg/dL (ref 0–99)
Triglycerides: 55 mg/dL (ref 0–149)
VLDL Cholesterol Cal: 11 mg/dL (ref 5–40)

## 2020-09-10 LAB — HEPATIC FUNCTION PANEL
ALT: 35 IU/L (ref 0–44)
AST: 34 IU/L (ref 0–40)
Albumin: 4.5 g/dL (ref 3.7–4.7)
Alkaline Phosphatase: 72 IU/L (ref 44–121)
Bilirubin Total: 0.5 mg/dL (ref 0.0–1.2)
Bilirubin, Direct: 0.13 mg/dL (ref 0.00–0.40)
Total Protein: 6.7 g/dL (ref 6.0–8.5)

## 2020-10-08 DIAGNOSIS — E782 Mixed hyperlipidemia: Secondary | ICD-10-CM | POA: Diagnosis not present

## 2020-10-08 DIAGNOSIS — I10 Essential (primary) hypertension: Secondary | ICD-10-CM | POA: Diagnosis not present

## 2020-10-08 DIAGNOSIS — I2510 Atherosclerotic heart disease of native coronary artery without angina pectoris: Secondary | ICD-10-CM | POA: Diagnosis not present

## 2020-10-08 DIAGNOSIS — R7303 Prediabetes: Secondary | ICD-10-CM | POA: Diagnosis not present

## 2020-12-10 DIAGNOSIS — D225 Melanocytic nevi of trunk: Secondary | ICD-10-CM | POA: Diagnosis not present

## 2020-12-10 DIAGNOSIS — D2239 Melanocytic nevi of other parts of face: Secondary | ICD-10-CM | POA: Diagnosis not present

## 2020-12-10 DIAGNOSIS — D485 Neoplasm of uncertain behavior of skin: Secondary | ICD-10-CM | POA: Diagnosis not present

## 2020-12-10 DIAGNOSIS — D1801 Hemangioma of skin and subcutaneous tissue: Secondary | ICD-10-CM | POA: Diagnosis not present

## 2020-12-10 DIAGNOSIS — L820 Inflamed seborrheic keratosis: Secondary | ICD-10-CM | POA: Diagnosis not present

## 2021-01-10 DIAGNOSIS — I2510 Atherosclerotic heart disease of native coronary artery without angina pectoris: Secondary | ICD-10-CM | POA: Diagnosis not present

## 2021-01-10 DIAGNOSIS — R7303 Prediabetes: Secondary | ICD-10-CM | POA: Diagnosis not present

## 2021-01-10 DIAGNOSIS — I10 Essential (primary) hypertension: Secondary | ICD-10-CM | POA: Diagnosis not present

## 2021-01-10 DIAGNOSIS — E782 Mixed hyperlipidemia: Secondary | ICD-10-CM | POA: Diagnosis not present

## 2021-03-06 ENCOUNTER — Other Ambulatory Visit: Payer: Self-pay

## 2021-03-10 ENCOUNTER — Encounter: Payer: Self-pay | Admitting: Cardiology

## 2021-03-10 ENCOUNTER — Other Ambulatory Visit: Payer: Self-pay

## 2021-03-10 ENCOUNTER — Ambulatory Visit: Payer: PPO | Admitting: Cardiology

## 2021-03-10 VITALS — BP 126/82 | HR 67 | Ht 76.0 in | Wt 193.6 lb

## 2021-03-10 DIAGNOSIS — M791 Myalgia, unspecified site: Secondary | ICD-10-CM | POA: Diagnosis not present

## 2021-03-10 DIAGNOSIS — T466X5A Adverse effect of antihyperlipidemic and antiarteriosclerotic drugs, initial encounter: Secondary | ICD-10-CM | POA: Diagnosis not present

## 2021-03-10 DIAGNOSIS — E782 Mixed hyperlipidemia: Secondary | ICD-10-CM | POA: Diagnosis not present

## 2021-03-10 DIAGNOSIS — I10 Essential (primary) hypertension: Secondary | ICD-10-CM | POA: Diagnosis not present

## 2021-03-10 DIAGNOSIS — G720 Drug-induced myopathy: Secondary | ICD-10-CM

## 2021-03-10 DIAGNOSIS — I2510 Atherosclerotic heart disease of native coronary artery without angina pectoris: Secondary | ICD-10-CM

## 2021-03-10 NOTE — Patient Instructions (Addendum)

## 2021-03-10 NOTE — Progress Notes (Signed)
Cardiology Office Note:    Date:  03/10/2021   ID:  Ruben Jackson, DOB April 14, 1951, MRN [id number]  PCP:  Garwin Brothers, MD  Cardiologist:  Jenean Lindau, MD   Referring MD: Garwin Brothers, MD    ASSESSMENT:    1. Coronary artery disease involving native coronary artery of native heart without angina pectoris   2. Mixed hyperlipidemia   3. Primary hypertension    PLAN:    In order of problems listed above:  Secondary prevention stressed with the patient.  Importance of compliance with diet medication stressed and he vocalized understanding.  He was advised to walk at least half an hour a day 5 days a week and he promises to do so. Essential hypertension: Blood pressure stable and diet was emphasized. Mixed dyslipidemia: Lipids reviewed and they were found to be fine.  He is statin intolerant and he is on Repatha.  He will be back in the next few days for liver lipid check.  Diet emphasized. Patient will be seen in follow-up appointment in 6 months or earlier if the patient has any concerns    Medication Adjustments/Labs and Tests Ordered: Current medicines are reviewed at length with the patient today.  Concerns regarding medicines are outlined above.  No orders of the defined types were placed in this encounter.  No orders of the defined types were placed in this encounter.    Chief Complaint  Patient presents with   chest discomfort     1 week ago      History of Present Illness:    Ruben Jackson is a 70 y.o. male.  Patient has past medical history of coronary artery disease, essential hypertension and dyslipidemia.  He denies any problems at this time and takes care of activities of daily living.  No chest pain orthopnea or PND.  At the time of my evaluation, the patient is alert awake oriented and in no distress.  He is active on a regular basis and does a lot of physical work without any symptoms.  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   Coenzyme Q10 (CO Q-10) 200 MG CAPS Take 1 capsule by mouth daily.   ezetimibe (ZETIA) 10 MG tablet Take 1 tablet by mouth daily.   losartan (COZAAR) 25 MG tablet Take 1 tablet by mouth daily.   metoprolol tartrate (LOPRESSOR) 50 MG tablet Take 1 tablet by mouth daily.   nitroGLYCERIN (NITROSTAT) 0.4 MG SL tablet Place 1 tablet (0.4 mg total) under the tongue every 5 (five) minutes as needed for chest pain.   REPATHA SURECLICK [id number] MG/ML SOAJ Inject 140 mg into the skin every 14 (fourteen) days.      Allergies:   Lisinopril and Penicillins   Social History   Socioeconomic History   Marital status: Married    Spouse name:  Not on file   Number of children: Not on file   Years of education: Not on file   Highest education level: Not on file  Occupational History   Not on file  Tobacco Use   Smoking status: Never   Smokeless tobacco: Never  Substance and Sexual Activity   Alcohol use: Not on file   Drug use: Not on file   Sexual activity: Not on file  Other Topics Concern   Not on file  Social History Narrative   Not on file   Social Determinants of Health   Financial Resource Strain: Not on file  Food Insecurity: Not on file  Transportation Needs: Not on file  Physical Activity: Not on file  Stress: Not on file  Social Connections: Not on file     Family History: The patient's family  history includes Hypertension in his mother.  ROS:   Please see the history of present illness.    All other systems reviewed and are negative.  EKGs/Labs/Other Studies Reviewed:    The following studies were reviewed today: EKG reveals sinus bradycardia nonspecific ST-T changes   Recent Labs: 09/10/2020: ALT 35; BUN 14; Creatinine, Ser 0.85; Potassium 4.1; Sodium 141  Recent Lipid Panel    Component Value Date/Time   CHOL 153 09/10/2020 0811   TRIG 55 09/10/2020 0811   HDL 66 09/10/2020 0811   CHOLHDL 2.3 09/10/2020 0811   LDLCALC 76 09/10/2020 0811    Physical Exam:    VS:  BP 126/82 (BP Location: Left Arm, Patient Position: Sitting)   Pulse 67   Ht '6\' 4"'$  (1.93 m)   Wt 193 lb 9.6 oz (87.8 kg)   SpO2 96%   BMI 23.57 kg/m     Wt Readings from Last 3 Encounters:  03/10/21 193 lb 9.6 oz (87.8 kg)  09/09/20 196 lb (88.9 kg)  03/07/20 198 lb 12.8 oz (90.2 kg)     GEN: Patient is in no acute distress HEENT: Normal NECK: No JVD; No carotid bruits LYMPHATICS: No lymphadenopathy CARDIAC: Hear sounds regular, 2/6 systolic murmur at the apex. RESPIRATORY:  Clear to auscultation without rales, wheezing or rhonchi  ABDOMEN: Soft, non-tender, non-distended MUSCULOSKELETAL:  No edema; No deformity  SKIN: Warm and dry NEUROLOGIC:  Alert and oriented x 3 PSYCHIATRIC:  Normal affect   Signed, Jenean Lindau, MD  03/10/2021 1:04 PM    [facility]

## 2021-03-11 DIAGNOSIS — I2510 Atherosclerotic heart disease of native coronary artery without angina pectoris: Secondary | ICD-10-CM | POA: Diagnosis not present

## 2021-03-11 DIAGNOSIS — E782 Mixed hyperlipidemia: Secondary | ICD-10-CM | POA: Diagnosis not present

## 2021-03-11 DIAGNOSIS — I10 Essential (primary) hypertension: Secondary | ICD-10-CM | POA: Diagnosis not present

## 2021-03-12 LAB — BASIC METABOLIC PANEL
BUN/Creatinine Ratio: 16 (ref 10–24)
BUN: 12 mg/dL (ref 8–27)
CO2: 23 mmol/L (ref 20–29)
Calcium: 8.8 mg/dL (ref 8.6–10.2)
Chloride: 103 mmol/L (ref 96–106)
Creatinine, Ser: 0.76 mg/dL (ref 0.76–1.27)
Glucose: 97 mg/dL (ref 65–99)
Potassium: 4.4 mmol/L (ref 3.5–5.2)
Sodium: 141 mmol/L (ref 134–144)
eGFR: 96 mL/min/{1.73_m2} (ref >59)

## 2021-03-12 LAB — CBC WITH DIFFERENTIAL/PLATELET
Basophils Absolute: 0 10*3/uL (ref 0.0–0.2)
Basos: 1 %
EOS (ABSOLUTE): 0.1 10*3/uL (ref 0.0–0.4)
Eos: 4 %
Hematocrit: 42.1 % (ref 37.5–51.0)
Hemoglobin: 14.3 g/dL (ref 13.0–17.7)
Immature Grans (Abs): 0 10*3/uL (ref 0.0–0.1)
Immature Granulocytes: 0 %
Lymphocytes Absolute: 1.1 10*3/uL (ref 0.7–3.1)
Lymphs: 36 %
MCH: 31 pg (ref 26.6–33.0)
MCHC: 34 g/dL (ref 31.5–35.7)
MCV: 91 fL (ref 79–97)
Monocytes Absolute: 0.3 10*3/uL (ref 0.1–0.9)
Monocytes: 10 %
Neutrophils Absolute: 1.6 10*3/uL (ref 1.4–7.0)
Neutrophils: 49 %
Platelets: 237 10*3/uL (ref 150–450)
RBC: 4.62 x10E6/uL (ref 4.14–5.80)
RDW: 12.5 % (ref 11.6–15.4)
WBC: 3.2 10*3/uL — ABNORMAL LOW (ref 3.4–10.8)

## 2021-03-12 LAB — HEPATIC FUNCTION PANEL
ALT: 31 IU/L (ref 0–44)
AST: 34 IU/L (ref 0–40)
Albumin: 4.4 g/dL (ref 3.7–4.7)
Alkaline Phosphatase: 72 IU/L (ref 44–121)
Bilirubin Total: 0.4 mg/dL (ref 0.0–1.2)
Bilirubin, Direct: <0.1 mg/dL (ref 0.00–0.40)
Total Protein: 6.5 g/dL (ref 6.0–8.5)

## 2021-03-12 LAB — LIPID PANEL
Chol/HDL Ratio: 2.9 ratio (ref 0.0–5.0)
Cholesterol, Total: 195 mg/dL (ref 100–199)
HDL: 67 mg/dL (ref >39)
LDL Chol Calc (NIH): 117 mg/dL — ABNORMAL HIGH (ref 0–99)
Triglycerides: 58 mg/dL (ref 0–149)
VLDL Cholesterol Cal: 11 mg/dL (ref 5–40)

## 2021-03-12 LAB — TSH: TSH: 1.85 u[IU]/mL (ref 0.450–4.500)

## 2021-03-12 NOTE — Addendum Note (Signed)
Addended by: Truddie Hidden on: 03/12/2021 05:14 PM   Modules accepted: Orders

## 2021-04-15 DIAGNOSIS — E782 Mixed hyperlipidemia: Secondary | ICD-10-CM | POA: Diagnosis not present

## 2021-04-15 DIAGNOSIS — I10 Essential (primary) hypertension: Secondary | ICD-10-CM | POA: Diagnosis not present

## 2021-04-15 DIAGNOSIS — Z23 Encounter for immunization: Secondary | ICD-10-CM | POA: Diagnosis not present

## 2021-04-15 DIAGNOSIS — I2510 Atherosclerotic heart disease of native coronary artery without angina pectoris: Secondary | ICD-10-CM | POA: Diagnosis not present

## 2021-04-15 DIAGNOSIS — R7303 Prediabetes: Secondary | ICD-10-CM | POA: Diagnosis not present

## 2021-04-23 DIAGNOSIS — R0789 Other chest pain: Secondary | ICD-10-CM | POA: Diagnosis not present

## 2021-04-23 DIAGNOSIS — Z20828 Contact with and (suspected) exposure to other viral communicable diseases: Secondary | ICD-10-CM | POA: Diagnosis not present

## 2021-04-23 DIAGNOSIS — R070 Pain in throat: Secondary | ICD-10-CM | POA: Diagnosis not present

## 2021-04-23 DIAGNOSIS — J209 Acute bronchitis, unspecified: Secondary | ICD-10-CM | POA: Diagnosis not present

## 2021-06-11 DIAGNOSIS — S56921A Laceration of unspecified muscles, fascia and tendons at forearm level, right arm, initial encounter: Secondary | ICD-10-CM | POA: Diagnosis not present

## 2021-07-15 DIAGNOSIS — R7303 Prediabetes: Secondary | ICD-10-CM | POA: Diagnosis not present

## 2021-07-15 DIAGNOSIS — E782 Mixed hyperlipidemia: Secondary | ICD-10-CM | POA: Diagnosis not present

## 2021-07-15 DIAGNOSIS — I10 Essential (primary) hypertension: Secondary | ICD-10-CM | POA: Diagnosis not present

## 2021-07-27 DIAGNOSIS — M791 Myalgia, unspecified site: Secondary | ICD-10-CM | POA: Diagnosis not present

## 2021-07-27 DIAGNOSIS — R059 Cough, unspecified: Secondary | ICD-10-CM | POA: Diagnosis not present

## 2021-07-27 DIAGNOSIS — Z20828 Contact with and (suspected) exposure to other viral communicable diseases: Secondary | ICD-10-CM | POA: Diagnosis not present

## 2021-07-27 DIAGNOSIS — J019 Acute sinusitis, unspecified: Secondary | ICD-10-CM | POA: Diagnosis not present

## 2021-08-22 ENCOUNTER — Encounter: Payer: Self-pay | Admitting: Cardiology

## 2021-08-22 ENCOUNTER — Other Ambulatory Visit: Payer: Self-pay

## 2021-08-22 ENCOUNTER — Ambulatory Visit: Payer: PPO | Admitting: Cardiology

## 2021-08-22 VITALS — BP 120/78 | HR 84 | Ht 75.6 in | Wt 190.6 lb

## 2021-08-22 DIAGNOSIS — I2510 Atherosclerotic heart disease of native coronary artery without angina pectoris: Secondary | ICD-10-CM | POA: Diagnosis not present

## 2021-08-22 DIAGNOSIS — E782 Mixed hyperlipidemia: Secondary | ICD-10-CM

## 2021-08-22 DIAGNOSIS — Z789 Other specified health status: Secondary | ICD-10-CM

## 2021-08-22 DIAGNOSIS — E785 Hyperlipidemia, unspecified: Secondary | ICD-10-CM

## 2021-08-22 DIAGNOSIS — I10 Essential (primary) hypertension: Secondary | ICD-10-CM | POA: Diagnosis not present

## 2021-08-22 DIAGNOSIS — M6281 Muscle weakness (generalized): Secondary | ICD-10-CM | POA: Diagnosis not present

## 2021-08-22 NOTE — Progress Notes (Signed)
Cardiology Office Note:    Date:  08/22/2021   ID:  Ruben Jackson, DOB 03-17-1951, MRN [id number]  PCP:  Garwin Brothers, MD  Cardiologist:  Jenean Lindau, MD   Referring MD: Garwin Brothers, MD    ASSESSMENT:    1. Coronary artery disease involving native coronary artery of native heart without angina pectoris   2. Mixed hyperlipidemia   3. Primary hypertension   4. Dyslipidemia   5. Muscle weakness (generalized)   6. Statin intolerance    PLAN:    In order of problems listed above:  Coronary artery disease: Secondary prevention stressed with patient.  Importance of compliance with diet medication stressed to him.  I advised him to walk at least half an hour a day 5 days a week and he promises to do so. Essential hypertension: Blood pressure stable and diet was emphasized.  Lifestyle modification urged. Mixed dyslipidemia: Lipids were reviewed they are mildly elevated.  Diet was emphasized.  Again this is followed by lipid clinic folks. Patient will be seen in follow-up appointment in 6 months or earlier if the patient has any concerns    Medication Adjustments/Labs and Tests Ordered: Current medicines are reviewed at length with the patient today.  Concerns regarding medicines are outlined above.  No orders of the defined types were placed in this encounter.  No orders of the defined types were placed in this encounter.    No chief complaint on file.    History of Present Illness:    Ruben Jackson is a 71 y.o. male.  Patient has past medical history of coronary artery disease, essential hypertension, dyslipidemia and statin intolerance.  He is on PCSK9 group of medications.  His lipids are followed by our lipid clinic.  He denies any chest pain orthopnea or PND.  At the time of my evaluation, the patient is alert awake oriented and in no distress.  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   Coenzyme Q10 (CO Q-10) 200 MG CAPS Take 1 capsule by mouth daily.   ezetimibe (ZETIA) 10 MG tablet Take 1 tablet by mouth daily.   flurbiprofen (ANSAID) 100 MG tablet Take 1 tablet by mouth daily.   losartan (COZAAR) 25 MG tablet Take 1 tablet by mouth daily.   metoprolol tartrate (LOPRESSOR) 50 MG tablet Take 1 tablet by mouth daily.   nitroGLYCERIN (NITROSTAT) 0.4 MG SL tablet Place 1 tablet (0.4 mg total) under the tongue every 5 (five) minutes as needed for chest pain.   REPATHA SURECLICK 761 MG/ML SOAJ Inject 140 mg into the skin every 14 (fourteen) days.    sildenafil (REVATIO) 20 MG tablet Take 20 mg by mouth as needed for erectile dysfunction (ED).     Allergies:   Lisinopril, Penicillins, and Statins   Social History   Socioeconomic History  Marital status: Married    Spouse name: Not on file   Number of children: Not on file   Years of education: Not on file   Highest education level: Not on file  Occupational History   Not on file  Tobacco Use   Smoking status: Never   Smokeless tobacco: Never  Substance and Sexual Activity   Alcohol use: Not on file   Drug use: Not on file   Sexual activity: Not on file  Other Topics Concern   Not on file  Social History Narrative   Not on file   Social Determinants of Health   Financial Resource Strain: Not on file  Food Insecurity: Not on file  Transportation Needs: Not on file  Physical Activity: Not  on file  Stress: Not on file  Social Connections: Not on file     Family History: The patient's family history includes Hypertension in his mother.  ROS:   Please see the history of present illness.    All other systems reviewed and are negative.  EKGs/Labs/Other Studies Reviewed:    The following studies were reviewed today: I discussed my findings with the patient at length.   Recent Labs: 03/11/2021: ALT 31; BUN 12; Creatinine, Ser 0.76; Hemoglobin 14.3; Platelets 237; Potassium 4.4; Sodium 141; TSH 1.850  Recent Lipid Panel    Component Value Date/Time   CHOL 195 03/11/2021 0914   TRIG 58 03/11/2021 0914   HDL 67 03/11/2021 0914   CHOLHDL 2.9 03/11/2021 0914   LDLCALC 117 (H) 03/11/2021 0914    Physical Exam:    VS:  BP 120/78    Pulse 84    Ht 6' 3.6" (1.92 m)    Wt 190 lb 9.6 oz (86.5 kg)    SpO2 98%    BMI 23.45 kg/m     Wt Readings from Last 3 Encounters:  08/22/21 190 lb 9.6 oz (86.5 kg)  03/10/21 193 lb 9.6 oz (87.8 kg)  09/09/20 196 lb (88.9 kg)     GEN: Patient is in no acute distress HEENT: Normal NECK: No JVD; No carotid bruits LYMPHATICS: No lymphadenopathy CARDIAC: Hear sounds regular, 2/6 systolic murmur at the apex. RESPIRATORY:  Clear to auscultation without rales, wheezing or rhonchi  ABDOMEN: Soft, non-tender, non-distended MUSCULOSKELETAL:  No edema; No deformity  SKIN: Warm and dry NEUROLOGIC:  Alert and oriented x 3 PSYCHIATRIC:  Normal affect   Signed, Jenean Lindau, MD  08/22/2021 8:42 AM    [place]

## 2021-08-22 NOTE — Patient Instructions (Addendum)
[phone] for questions  Medication Instructions:  Your physician recommends that you continue on your current medications as directed. Please refer to the Current Medication list given to you today.  *If you need a refill on your cardiac medications before your next appointment, please call your pharmacy*   Lab Work: Your physician recommends that you return for lab work in: 3 months after diet and exercise. You need to have labs done when you are fasting.  You can come Monday through Friday 8:30 am to 12:00 pm and 1:15 to 4:30. You do not need to make an appointment as the order has already been placed. The labs you are going to have done are BMET, LFT and Lipids.  If you have labs (blood work) drawn today and your tests are completely normal, you will receive your results only by: [place] (if you have MyChart) OR A paper copy in the mail If you have any lab test that is abnormal or we need to change your treatment, we will call you to review the results.   Testing/Procedures: None ordered   Follow-Up: At [facility], you and your health needs are our priority.  As part of our continuing mission to provide you with exceptional heart care, we have created designated Provider Care Teams.  These Care Teams include your primary Cardiologist (physician) and Advanced Practice Providers (APPs -  Physician Assistants and Nurse Practitioners) who all work together to provide you with the care you need, when you need it.  We recommend signing up for the patient portal called "MyChart".  Sign up information is provided on this After Visit Summary.  MyChart is used to connect with patients for Virtual Visits (Telemedicine).  Patients are able to view lab/test results, encounter notes, upcoming appointments, etc.  Non-urgent messages can be sent to your provider as well.   To learn more about what you can do with MyChart, go to [email or web address].    Your next appointment:   6  month(s)  The format for your next appointment:   In Person  Provider:   Jyl Heinz, MD   Other Instructions Fat and Cholesterol Restricted Eating Plan Getting too much fat and cholesterol in your diet may cause health problems. Choosing the right foods helps keep your fat and cholesterol at normal levels. This can keep you from getting certain diseases.  What are tips for following this plan? General tips Work with your doctor to lose weight if you need to. Avoid: Foods with added sugar. Fried foods. Foods with trans fat or partially hydrogenated oils. This includes some margarines and baked goods. If you drink alcohol: Limit how much you have to: 0-1 drink a day for women who are not pregnant. 0-2 drinks a day for men. Know how much alcohol is in a drink. In the [place] one drink equals one 12 oz bottle of beer (355 mL), one 5 oz glass of wine (148 mL), or one 1 oz glass of hard liquor (44 mL). Reading food labels Check food labels for: Trans fats. Partially hydrogenated oils. Saturated fat (g) in each serving. Cholesterol (mg) in each serving. Fiber (g) in each serving. Choose foods with healthy fats, such as: Monounsaturated fats and polyunsaturated fats. These include olive and canola oil, flaxseeds, walnuts, almonds, and seeds. Omega-3 fats. These are found in certain fish, flaxseed oil, and ground flaxseeds. Choose grain products that have whole grains. Look for the word "whole" as the first word in the ingredient list. Cooking Cook foods  using low-fat methods. These include baking, boiling, grilling, and broiling. Eat more home-cooked foods. Eat at restaurants and buffets less often. Eat less fast food. Avoid cooking using saturated fats, such as butter, cream, palm oil, palm kernel oil, and coconut oil. Meal planning  At meals, divide your plate into four equal parts: Fill one-half of your plate with vegetables, green salads, and fruit. Fill one-fourth of your  plate with whole grains. Fill one-fourth of your plate with low-fat (lean) protein foods. Eat fish that is high in omega-3 fats at least two times a week. This includes mackerel, tuna, sardines, and salmon. Eat foods that are high in fiber, such as whole grains, beans, apples, pears, berries, broccoli, carrots, peas, and barley. What foods should I eat? Fruits All fresh, canned (in natural juice), or frozen fruits. Vegetables Fresh or frozen vegetables (raw, steamed, roasted, or grilled). Green salads. Grains Whole grains, such as whole wheat or whole grain breads, crackers, cereals, and pasta. Unsweetened oatmeal, bulgur, barley, quinoa, or brown rice. Corn or whole wheat flour tortillas. Meats and other protein foods Ground beef (85% or leaner), grass-fed beef, or beef trimmed of fat. Skinless chicken or [place]. Ground chicken or [place]. Pork trimmed of fat. All fish and seafood. Egg whites. Dried beans, peas, or lentils. Unsalted nuts or seeds. Unsalted canned beans. Nut butters without added sugar or oil. Dairy Low-fat or nonfat dairy products, such as skim or 1% milk, 2% or reduced-fat cheeses, low-fat and fat-free ricotta or cottage cheese, or plain low-fat and nonfat yogurt. Fats and oils Tub margarine without trans fats. Light or reduced-fat mayonnaise and salad dressings. Avocado. Olive, canola, sesame, or safflower oils. The items listed above may not be a complete list of foods and beverages you can eat. Contact a dietitian for more information. What foods should I avoid? Fruits Canned fruit in heavy syrup. Fruit in cream or butter sauce. Fried fruit. Vegetables Vegetables cooked in cheese, cream, or butter sauce. Fried vegetables. Grains White bread. White pasta. White rice. Cornbread. Bagels, pastries, and croissants. Crackers and snack foods that contain trans fat and hydrogenated oils. Meats and other protein foods Fatty cuts of meat. Ribs, chicken wings, bacon, sausage,  bologna, salami, chitterlings, fatback, hot dogs, bratwurst, and packaged lunch meats. Liver and organ meats. Whole eggs and egg yolks. Chicken and [place] with skin. Fried meat. Dairy Whole or 2% milk, cream, half-and-half, and cream cheese. Whole milk cheeses. Whole-fat or sweetened yogurt. Full-fat cheeses. Nondairy creamers and whipped toppings. Processed cheese, cheese spreads, and cheese curds. Fats and oils Butter, stick margarine, lard, shortening, ghee, or bacon fat. Coconut, palm kernel, and palm oils. Beverages Alcohol. Sugar-sweetened drinks such as sodas, lemonade, and fruit drinks. Sweets and desserts Corn syrup, sugars, honey, and molasses. Candy. Jam and jelly. Syrup. Sweetened cereals. Cookies, pies, cakes, donuts, muffins, and ice cream. The items listed above may not be a complete list of foods and beverages you should avoid. Contact a dietitian for more information. Summary Choosing the right foods helps keep your fat and cholesterol at normal levels. This can keep you from getting certain diseases. At meals, fill one-half of your plate with vegetables, green salads, and fruits. Eat high fiber foods, like whole grains, beans, apples, pears, berries, carrots, peas, and barley. Limit added sugar, saturated fats, alcohol, and fried foods. This information is not intended to replace advice given to you by your health care provider. Make sure you discuss any questions you have with your health care provider. Document  Revised: 11/22/2020 Document Reviewed: 11/22/2020 Elsevier Patient Education  [place].

## 2021-08-22 NOTE — Addendum Note (Signed)
Addended by: Truddie Hidden on: 08/22/2021 09:13 AM   Modules accepted: Orders

## 2021-09-22 DIAGNOSIS — L300 Nummular dermatitis: Secondary | ICD-10-CM | POA: Diagnosis not present

## 2021-09-22 DIAGNOSIS — L299 Pruritus, unspecified: Secondary | ICD-10-CM | POA: Diagnosis not present

## 2021-09-22 DIAGNOSIS — D485 Neoplasm of uncertain behavior of skin: Secondary | ICD-10-CM | POA: Diagnosis not present

## 2021-09-25 DIAGNOSIS — C44311 Basal cell carcinoma of skin of nose: Secondary | ICD-10-CM | POA: Diagnosis not present

## 2021-10-14 DIAGNOSIS — Z6823 Body mass index (BMI) 23.0-23.9, adult: Secondary | ICD-10-CM | POA: Diagnosis not present

## 2021-10-14 DIAGNOSIS — R6889 Other general symptoms and signs: Secondary | ICD-10-CM | POA: Diagnosis not present

## 2021-10-14 DIAGNOSIS — E782 Mixed hyperlipidemia: Secondary | ICD-10-CM | POA: Diagnosis not present

## 2021-10-14 DIAGNOSIS — Z0000 Encounter for general adult medical examination without abnormal findings: Secondary | ICD-10-CM | POA: Diagnosis not present

## 2021-10-14 DIAGNOSIS — I10 Essential (primary) hypertension: Secondary | ICD-10-CM | POA: Diagnosis not present

## 2021-10-14 DIAGNOSIS — I25118 Atherosclerotic heart disease of native coronary artery with other forms of angina pectoris: Secondary | ICD-10-CM | POA: Diagnosis not present

## 2021-10-14 DIAGNOSIS — Z1331 Encounter for screening for depression: Secondary | ICD-10-CM | POA: Diagnosis not present

## 2021-10-14 DIAGNOSIS — Z125 Encounter for screening for malignant neoplasm of prostate: Secondary | ICD-10-CM | POA: Diagnosis not present

## 2021-10-14 DIAGNOSIS — R7303 Prediabetes: Secondary | ICD-10-CM | POA: Diagnosis not present

## 2021-10-23 DIAGNOSIS — R4182 Altered mental status, unspecified: Secondary | ICD-10-CM | POA: Diagnosis not present

## 2021-10-23 DIAGNOSIS — R6889 Other general symptoms and signs: Secondary | ICD-10-CM | POA: Diagnosis not present

## 2021-11-12 DIAGNOSIS — G3184 Mild cognitive impairment, so stated: Secondary | ICD-10-CM | POA: Diagnosis not present

## 2021-11-12 DIAGNOSIS — E782 Mixed hyperlipidemia: Secondary | ICD-10-CM | POA: Diagnosis not present

## 2021-11-12 DIAGNOSIS — I10 Essential (primary) hypertension: Secondary | ICD-10-CM | POA: Diagnosis not present

## 2021-12-26 DIAGNOSIS — C44311 Basal cell carcinoma of skin of nose: Secondary | ICD-10-CM | POA: Diagnosis not present

## 2021-12-26 DIAGNOSIS — D225 Melanocytic nevi of trunk: Secondary | ICD-10-CM | POA: Diagnosis not present

## 2021-12-26 DIAGNOSIS — D485 Neoplasm of uncertain behavior of skin: Secondary | ICD-10-CM | POA: Diagnosis not present

## 2021-12-26 DIAGNOSIS — D2239 Melanocytic nevi of other parts of face: Secondary | ICD-10-CM | POA: Diagnosis not present

## 2021-12-26 DIAGNOSIS — L570 Actinic keratosis: Secondary | ICD-10-CM | POA: Diagnosis not present

## 2021-12-26 DIAGNOSIS — L820 Inflamed seborrheic keratosis: Secondary | ICD-10-CM | POA: Diagnosis not present

## 2022-02-10 ENCOUNTER — Ambulatory Visit: Payer: PPO | Admitting: Cardiology

## 2022-02-20 DIAGNOSIS — E782 Mixed hyperlipidemia: Secondary | ICD-10-CM | POA: Diagnosis not present

## 2022-02-20 DIAGNOSIS — I10 Essential (primary) hypertension: Secondary | ICD-10-CM | POA: Diagnosis not present

## 2022-02-20 DIAGNOSIS — G3184 Mild cognitive impairment, so stated: Secondary | ICD-10-CM | POA: Diagnosis not present

## 2022-05-20 DIAGNOSIS — Z23 Encounter for immunization: Secondary | ICD-10-CM | POA: Diagnosis not present

## 2022-05-20 DIAGNOSIS — E782 Mixed hyperlipidemia: Secondary | ICD-10-CM | POA: Diagnosis not present

## 2022-05-20 DIAGNOSIS — I10 Essential (primary) hypertension: Secondary | ICD-10-CM | POA: Diagnosis not present

## 2022-05-20 DIAGNOSIS — G3184 Mild cognitive impairment, so stated: Secondary | ICD-10-CM | POA: Diagnosis not present

## 2022-07-09 ENCOUNTER — Other Ambulatory Visit: Payer: Self-pay

## 2022-07-09 MED ORDER — LOSARTAN POTASSIUM 25 MG PO TABS: 25.0000 mg | ORAL_TABLET | Freq: Every day | ORAL | 1 refills | Status: DC

## 2022-07-09 MED ORDER — EZETIMIBE 10 MG PO TABS: 10.0000 mg | ORAL_TABLET | Freq: Every day | ORAL | 1 refills | Status: DC

## 2022-08-04 ENCOUNTER — Other Ambulatory Visit: Payer: Self-pay

## 2022-08-04 MED ORDER — DONEPEZIL HCL 5 MG PO TABS: 5.0000 mg | ORAL_TABLET | Freq: Every day | ORAL | 3 refills | Status: DC

## 2022-08-21 ENCOUNTER — Ambulatory Visit: Payer: PPO | Admitting: Internal Medicine

## 2022-08-21 ENCOUNTER — Encounter: Payer: Self-pay | Admitting: Internal Medicine

## 2022-08-21 VITALS — BP 120/80 | HR 74 | Temp 97.8°F | Resp 18 | Ht 75.0 in | Wt 190.4 lb

## 2022-08-21 DIAGNOSIS — E785 Hyperlipidemia, unspecified: Secondary | ICD-10-CM | POA: Diagnosis not present

## 2022-08-21 DIAGNOSIS — G3184 Mild cognitive impairment, so stated: Secondary | ICD-10-CM | POA: Diagnosis not present

## 2022-08-21 DIAGNOSIS — I10 Essential (primary) hypertension: Secondary | ICD-10-CM | POA: Diagnosis not present

## 2022-08-21 DIAGNOSIS — F0390 Unspecified dementia without behavioral disturbance: Secondary | ICD-10-CM | POA: Insufficient documentation

## 2022-08-21 DIAGNOSIS — I2510 Atherosclerotic heart disease of native coronary artery without angina pectoris: Secondary | ICD-10-CM | POA: Diagnosis not present

## 2022-08-21 DIAGNOSIS — Z789 Other specified health status: Secondary | ICD-10-CM

## 2022-08-21 MED ORDER — DONEPEZIL HCL 10 MG PO TABS: 10.0000 mg | ORAL_TABLET | Freq: Every day | ORAL | 6 refills | Status: DC

## 2022-08-21 NOTE — Assessment & Plan Note (Signed)
He will take repatha and zetia.

## 2022-08-21 NOTE — Progress Notes (Signed)
Office Visit  Subjective   Patient ID: Ruben Jackson   DOB: Dec 11, 1950   Age: 72 y.o.   MRN: [id number]   Chief Complaint Chief Complaint  Patient presents with   Hypertension   Follow-up     Coronary Disease, Dyslipidemia, Hypertension     History of Present Illness The patient is a 72 year old Caucasian/White male who presents for a FOLLOW-UP. He has hypertension. The patient has not been checking his blood pressure at home. The patient's current medications include: losartan 25 mg tablet and METOPROLOL TARTRATE '50MG'$  TABLETS. The patient has been tolerating his medications well. The patient denies any chest pain, shortness of breath, orthopnea, and PND.  He reports there have been no other symptoms noted.  Ruben Jackson returns today for routine follow up on his cholesterol. He does not take repatha injection regularly. Overall, he states he is doing well and is without any complaints or problems at this time. He specifically denies chest pain, abdominal pain, nausea, diarrhea, and myalgias. He remains on dietary management as well as the following cholesterol lowering medications ezetimibe 10 mg tablet and Repatha SureClick 527 mg/mL subcutaneous pen injector. He had labs done 6 months ago including a full lipid panel ; these were reviewed at the visit today.    He also has mild cognitive deficit but says that things are getting better. He take Aricept 5 mg daily.    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   HENT: Negative.    Respiratory: Negative.    Cardiovascular: Negative.   Gastrointestinal: Negative.   Neurological: Negative.        Objective:    Vitals BP 120/80 (BP Location: Left Arm, Patient Position: Sitting, Cuff Size: Normal)   Pulse 74   Temp 97.8 F (36.6 C)   Resp 18   Ht '6\' 3"'$  (1.905 m)   Wt 190 lb 6 oz (86.4 kg)   SpO2 95%   BMI 23.80 kg/m    Physical Examination Physical Exam Constitutional:      Appearance: Normal appearance. He is normal weight.  HENT:     Head: Normocephalic and atraumatic.  Eyes:     Extraocular Movements: Extraocular movements intact.     Pupils: Pupils are equal, round, and reactive to light.  Cardiovascular:     Rate and Rhythm: Normal rate and regular rhythm.     Heart sounds: Normal heart sounds.  Pulmonary:     Effort: Pulmonary effort is normal.     Breath sounds: Normal breath sounds.  Abdominal:     General: Bowel sounds are normal.     Palpations: Abdomen is soft.  Neurological:     General: No focal deficit present.     Mental Status: He is alert and oriented to person, place, and time.  Assessment & Plan:   Statin intolerance He will take repatha and zetia.  Hypertension Blood pressure is controlled.   Coronary artery disease involving native coronary artery of native heart without angina pectoris He denies any chest pain and no SOB.   Mild cognitive impairment He is on aricept 5 mg daily, I will increase it to 10 mg daily.    Return in about 3 months (around 11/20/2022).   Garwin Brothers, MD

## 2022-08-21 NOTE — Assessment & Plan Note (Signed)
He is on aricept 5 mg daily, I will increase it to 10 mg daily.

## 2022-08-21 NOTE — Assessment & Plan Note (Signed)
Blood pressure is controlled

## 2022-08-21 NOTE — Assessment & Plan Note (Signed)
He denies any chest pain and no SOB.

## 2022-08-22 LAB — CMP14 + ANION GAP
ALT: 34 IU/L (ref 0–44)
AST: 39 IU/L (ref 0–40)
Albumin/Globulin Ratio: 2.4 — ABNORMAL HIGH (ref 1.2–2.2)
Albumin: 4.8 g/dL (ref 3.8–4.8)
Alkaline Phosphatase: 69 IU/L (ref 44–121)
Anion Gap: 14 mmol/L (ref 10.0–18.0)
BUN/Creatinine Ratio: 18 (ref 10–24)
BUN: 15 mg/dL (ref 8–27)
Bilirubin Total: 0.5 mg/dL (ref 0.0–1.2)
CO2: 23 mmol/L (ref 20–29)
Calcium: 9 mg/dL (ref 8.6–10.2)
Chloride: 103 mmol/L (ref 96–106)
Creatinine, Ser: 0.82 mg/dL (ref 0.76–1.27)
Globulin, Total: 2 g/dL (ref 1.5–4.5)
Glucose: 102 mg/dL — ABNORMAL HIGH (ref 70–99)
Potassium: 4.3 mmol/L (ref 3.5–5.2)
Sodium: 140 mmol/L (ref 134–144)
Total Protein: 6.8 g/dL (ref 6.0–8.5)
eGFR: 93 mL/min/{1.73_m2} (ref >59)

## 2022-08-22 LAB — LIPID PANEL
Chol/HDL Ratio: 2.5 ratio (ref 0.0–5.0)
Cholesterol, Total: 191 mg/dL (ref 100–199)
HDL: 75 mg/dL (ref >39)
LDL Chol Calc (NIH): 102 mg/dL — ABNORMAL HIGH (ref 0–99)
Triglycerides: 80 mg/dL (ref 0–149)
VLDL Cholesterol Cal: 14 mg/dL (ref 5–40)

## 2022-08-24 NOTE — Progress Notes (Signed)
Patient called.  Patient aware.  Labs are good, need to keep taking medicine including repatha injection

## 2022-09-03 ENCOUNTER — Other Ambulatory Visit: Payer: Self-pay | Admitting: Internal Medicine

## 2022-09-08 DIAGNOSIS — C44311 Basal cell carcinoma of skin of nose: Secondary | ICD-10-CM | POA: Diagnosis not present

## 2022-09-08 DIAGNOSIS — L579 Skin changes due to chronic exposure to nonionizing radiation, unspecified: Secondary | ICD-10-CM | POA: Diagnosis not present

## 2022-11-23 ENCOUNTER — Ambulatory Visit: Payer: PPO | Admitting: Internal Medicine

## 2022-11-23 ENCOUNTER — Encounter: Payer: Self-pay | Admitting: Internal Medicine

## 2022-11-23 VITALS — BP 120/80 | HR 55 | Temp 97.9°F | Resp 18 | Ht 76.5 in | Wt 187.4 lb

## 2022-11-23 DIAGNOSIS — G3184 Mild cognitive impairment, so stated: Secondary | ICD-10-CM

## 2022-11-23 DIAGNOSIS — I2510 Atherosclerotic heart disease of native coronary artery without angina pectoris: Secondary | ICD-10-CM | POA: Diagnosis not present

## 2022-11-23 DIAGNOSIS — Z789 Other specified health status: Secondary | ICD-10-CM

## 2022-11-23 DIAGNOSIS — E782 Mixed hyperlipidemia: Secondary | ICD-10-CM | POA: Diagnosis not present

## 2022-11-23 NOTE — Assessment & Plan Note (Signed)
He is on repatha and zetia without any side effects. Will do lipid panel on next visit

## 2022-11-23 NOTE — Assessment & Plan Note (Signed)
He says his started noticing improvement in his memory.

## 2022-11-23 NOTE — Progress Notes (Signed)
Office Visit  Subjective   Patient ID: Ruben Jackson   DOB: 1951/02/04   Age: 72 y.o.   MRN: [id number]   Chief Complaint Chief Complaint  Patient presents with   Follow-up    3 month follow up     History of Present Illness The patient is a 72 year old Caucasian/White male who presents for a FOLLOW-UP. He says that he cut woods at home, move lawn without any chest pain or SOB.  He has hypertension. The patient has not been checking his blood pressure at home. The patient's current medications include: losartan 25 mg tablet and METOPROLOL TARTRATE 50MG  TABLETS. The patient has been tolerating his medications well. The patient denies any chest pain, shortness of breath, orthopnea, and PND.  He reports there have been no other symptoms noted.  Ruben Jackson returns today for routine follow up on his cholesterol. He is taking zetia and repatha injection regularly. Overall, he states he is doing well and is without any complaints or problems at this time. He specifically denies chest pain, abdominal pain, nausea, diarrhea, and myalgias. He remains on dietary management as well as the following cholesterol lowering medications ezetimibe 10 mg tablet and Repatha SureClick 140 mg/mL subcutaneous pen injector.     He also has mild cognitive deficit but says that things are getting better. He take Aricept 10 mg daily.    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   HENT: Negative.    Respiratory: Negative.    Cardiovascular: Negative.   Gastrointestinal: Negative.   Neurological: Negative.        Objective:    Vitals BP 120/80 (BP Location: Left Arm, Patient Position: Sitting, Cuff Size: Normal)   Pulse (!) 55   Temp 97.9 F (36.6 C)   Resp 18   Ht 6' 4.5" (1.943 m)   Wt 187 lb 6 oz (85 kg)   SpO2 97%   BMI 22.51 kg/m    Physical Examination Physical Exam Constitutional:      Appearance: Normal appearance.  HENT:     Head: Normocephalic and atraumatic.  Eyes:     Extraocular Movements: Extraocular movements intact.     Pupils: Pupils are equal, round, and reactive to light.  Cardiovascular:     Rate and Rhythm: Normal rate and regular rhythm.     Heart sounds: Normal heart sounds.  Pulmonary:     Effort: Pulmonary effort is normal.     Breath sounds: Normal breath sounds.  Abdominal:     General: Bowel sounds are normal.     Palpations: Abdomen is soft.  Neurological:     General: No focal deficit present.     Mental Status: He is alert and oriented to person, place, and time.        Assessment & Plan:   Coronary artery  disease involving native coronary artery of native heart without angina pectoris He has not seen cardiologist since January 23, he denies any chest pain or SOB  Statin intolerance He is on repatha and zetia without any side effects. Will do lipid panel on next visit  Hyperlipidemia He is statin intolerant but is tolerating zetia and repatha, I will do lipid panel on next visit  Mild cognitive impairment He says his started noticing improvement in his memory.     Return in about 3 months (around 02/22/2023).   Eloisa Northern, MD

## 2022-11-23 NOTE — Assessment & Plan Note (Signed)
He has not seen cardiologist since January 23, he denies any chest pain or SOB

## 2022-11-23 NOTE — Assessment & Plan Note (Signed)
He is statin intolerant but is tolerating zetia and repatha, I will do lipid panel on next visit

## 2022-12-04 ENCOUNTER — Other Ambulatory Visit: Payer: Self-pay | Admitting: Internal Medicine

## 2023-01-18 ENCOUNTER — Other Ambulatory Visit: Payer: Self-pay | Admitting: Internal Medicine

## 2023-02-18 ENCOUNTER — Encounter: Payer: Self-pay | Admitting: Pharmacist

## 2023-02-18 DIAGNOSIS — M791 Myalgia, unspecified site: Secondary | ICD-10-CM

## 2023-02-18 NOTE — Progress Notes (Signed)
[facility])     [facility] Quality Pharmacy Team Statin Quality Measure Assessment  02/18/2023  AMADI FRADY 1951-03-27 [id number]  Per review of chart and payor information, Mr. Carlyon has a diagnosis of cardiovascular disease but is not currently filling a statin prescription. This places patient into the [facility] (Statin Use In Patients with Cardiovascular Disease) measure for CMS.    Patient has documented trials of statins with reported myalgias, but no corresponding CPT codes that would exclude patient from [facility] measure.  Please consider evaluating him for his past statin intolerance and add diagnosis code M79.10 if clinically applicable at tomorrow's office visit.  Please keep in mind the diagnosis code Z78.9 statin intolerance will NOT remove the patient from the measure.    Code for past statin intolerance  (required annually)  Provider Requirements: Must associate code during an office visit or telehealth encounter   Drug Induced Myopathy G72.0   Myalgia  M79.1   Myositis, unspecified M60.9   Myopathy, unspecified G72.9   Rhabdomyolysis M62.82   Thank you for allowing [facility] to be a part of this patient's care.  Dellie Burns, PharmD Clinical Pharmacist [place]  Direct Dial: [phone]

## 2023-02-19 ENCOUNTER — Ambulatory Visit: Payer: PPO | Admitting: Internal Medicine

## 2023-02-19 ENCOUNTER — Encounter: Payer: Self-pay | Admitting: Internal Medicine

## 2023-02-19 VITALS — BP 120/80 | HR 70 | Temp 97.4°F | Resp 18 | Ht 76.0 in | Wt 185.1 lb

## 2023-02-19 DIAGNOSIS — M791 Myalgia, unspecified site: Secondary | ICD-10-CM | POA: Diagnosis not present

## 2023-02-19 DIAGNOSIS — T466X5A Adverse effect of antihyperlipidemic and antiarteriosclerotic drugs, initial encounter: Secondary | ICD-10-CM | POA: Insufficient documentation

## 2023-02-19 DIAGNOSIS — I10 Essential (primary) hypertension: Secondary | ICD-10-CM

## 2023-02-19 DIAGNOSIS — G301 Alzheimer's disease with late onset: Secondary | ICD-10-CM

## 2023-02-19 DIAGNOSIS — I2510 Atherosclerotic heart disease of native coronary artery without angina pectoris: Secondary | ICD-10-CM

## 2023-02-19 DIAGNOSIS — E782 Mixed hyperlipidemia: Secondary | ICD-10-CM

## 2023-02-19 MED ORDER — DONEPEZIL HCL 5 MG PO TABS: 5.0000 mg | ORAL_TABLET | Freq: Every day | ORAL | 3 refills | Status: DC

## 2023-02-19 NOTE — Progress Notes (Signed)
Office Visit  Subjective   Patient ID: Donelle Eichner Petrosyan   DOB: [birth year]   Age: 72 y.o.   MRN: [id number]   Chief Complaint Chief Complaint  Patient presents with   Follow-up    CAD      History of Present Illness The patient is a 72 year old Caucasian/White male who presents for a FOLLOW-UP. His wife says that since I have increased the dose of aricept to 10 mg , he is more tearfully and he cut back to 5 mg and he is doing better. His 1 minute recall in our office today is 2/3. I have discussed with wife about adding Namenda but wife wanted to hold and monitor. No behavior problem.   He has hypertension. The patient has not been checking his blood pressure at home. The patient's current medications include: losartan 25 mg tablet and METOPROLOL TARTRATE 50MG  TABLETS. The patient has been tolerating his medications well. The patient denies any chest pain, shortness of breath, orthopnea, and PND.  He reports there have been no other symptoms noted.  Timonthy D. Carranco returns today for routine follow up on his cholesterol. He is taking zetia and repatha injection regularly. I will repeat lipid panel today. He could not tolerate statin because of mucle pain. .         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   HENT: Negative.    Respiratory: Negative.     Cardiovascular: Negative.   Gastrointestinal: Negative.   Neurological: Negative.   Psychiatric/Behavioral:  Positive for memory loss.        Objective:    Vitals BP 120/80 (BP Location: Left Arm, Patient Position: Sitting, Cuff Size: Normal)   Pulse 70   Temp (!) 97.4 F (36.3 C)   Resp 18   Ht 6\' 4"  (1.93 m)   Wt 185 lb 2 oz (84 kg)   SpO2 99%   BMI 22.53 kg/m    Physical Examination Physical Exam Constitutional:      Appearance: Normal appearance.  HENT:     Head: Normocephalic and atraumatic.  Cardiovascular:     Rate and Rhythm: Normal rate and regular rhythm.     Heart sounds: Normal heart sounds.  Pulmonary:     Effort: Pulmonary effort is normal.     Breath sounds: Normal breath sounds.  Abdominal:     General: Bowel sounds are normal.     Palpations: Abdomen is soft.  Neurological:     General: No focal deficit present.     Mental Status: He is alert and oriented to person, place, and time.        Assessment & Plan:   Hyperlipidemia I will repeat lipid panel today, he is not fasting.  Myalgia due to statin He could not tolerate any statin because of muscle pain. He is on Zetia  and getting Repatha injection. No side effects from Repatha.  Dementia (HCC) He did not tolerated 10 mg of aricept so will reduce to 5 mg daily.  CAD (coronary artery disease) No chest pain or SOB, last time he saw cardiologist was 1/23. Will monitor.  Hypertension Controlled.     Return in about 3 months (around 05/22/2023).   Eloisa Northern, MD

## 2023-02-19 NOTE — Assessment & Plan Note (Signed)
He did not tolerated 10 mg of aricept so will reduce to 5 mg daily.

## 2023-02-19 NOTE — Assessment & Plan Note (Signed)
He could not tolerate any statin because of muscle pain. He is on Zetia and getting Repatha injection. No side effects from Repatha.

## 2023-02-19 NOTE — Assessment & Plan Note (Signed)
I will repeat lipid panel today, he is not fasting.

## 2023-02-19 NOTE — Assessment & Plan Note (Signed)
No chest pain or SOB, last time he saw cardiologist was 1/23. Will monitor.

## 2023-02-19 NOTE — Assessment & Plan Note (Signed)
Controlled.  

## 2023-02-22 NOTE — Progress Notes (Signed)
Patient called.  Patient aware.  Patient states that he takes Repatha injection once a month.

## 2023-03-08 ENCOUNTER — Ambulatory Visit: Payer: PPO | Admitting: Internal Medicine

## 2023-03-09 ENCOUNTER — Other Ambulatory Visit: Payer: Self-pay | Admitting: Internal Medicine

## 2023-03-26 ENCOUNTER — Other Ambulatory Visit: Payer: Self-pay | Admitting: Internal Medicine

## 2023-04-20 DIAGNOSIS — C4441 Basal cell carcinoma of skin of scalp and neck: Secondary | ICD-10-CM | POA: Diagnosis not present

## 2023-04-20 DIAGNOSIS — L578 Other skin changes due to chronic exposure to nonionizing radiation: Secondary | ICD-10-CM | POA: Diagnosis not present

## 2023-04-20 DIAGNOSIS — C44311 Basal cell carcinoma of skin of nose: Secondary | ICD-10-CM | POA: Diagnosis not present

## 2023-04-20 DIAGNOSIS — L814 Other melanin hyperpigmentation: Secondary | ICD-10-CM | POA: Diagnosis not present

## 2023-04-20 DIAGNOSIS — D225 Melanocytic nevi of trunk: Secondary | ICD-10-CM | POA: Diagnosis not present

## 2023-04-20 DIAGNOSIS — L821 Other seborrheic keratosis: Secondary | ICD-10-CM | POA: Diagnosis not present

## 2023-05-18 DIAGNOSIS — C44311 Basal cell carcinoma of skin of nose: Secondary | ICD-10-CM | POA: Diagnosis not present

## 2023-05-24 ENCOUNTER — Encounter: Payer: Self-pay | Admitting: Internal Medicine

## 2023-05-24 ENCOUNTER — Ambulatory Visit: Payer: PPO | Admitting: Internal Medicine

## 2023-05-24 VITALS — BP 122/78 | HR 57 | Temp 97.8°F | Resp 18 | Ht 76.0 in | Wt 184.0 lb

## 2023-05-24 DIAGNOSIS — Z23 Encounter for immunization: Secondary | ICD-10-CM | POA: Insufficient documentation

## 2023-05-24 DIAGNOSIS — F02A0 Dementia in other diseases classified elsewhere, mild, without behavioral disturbance, psychotic disturbance, mood disturbance, and anxiety: Secondary | ICD-10-CM

## 2023-05-24 DIAGNOSIS — I2510 Atherosclerotic heart disease of native coronary artery without angina pectoris: Secondary | ICD-10-CM

## 2023-05-24 DIAGNOSIS — M791 Myalgia, unspecified site: Secondary | ICD-10-CM

## 2023-05-24 DIAGNOSIS — G301 Alzheimer's disease with late onset: Secondary | ICD-10-CM | POA: Diagnosis not present

## 2023-05-24 DIAGNOSIS — I10 Essential (primary) hypertension: Secondary | ICD-10-CM | POA: Diagnosis not present

## 2023-05-24 DIAGNOSIS — E785 Hyperlipidemia, unspecified: Secondary | ICD-10-CM

## 2023-05-24 DIAGNOSIS — T466X5A Adverse effect of antihyperlipidemic and antiarteriosclerotic drugs, initial encounter: Secondary | ICD-10-CM

## 2023-05-24 NOTE — Assessment & Plan Note (Signed)
I will continue with donepezil 5 mg daily.

## 2023-05-24 NOTE — Assessment & Plan Note (Signed)
He could not take any statin due to body ache.

## 2023-05-24 NOTE — Assessment & Plan Note (Signed)
No chest pain or SOB 

## 2023-05-24 NOTE — Assessment & Plan Note (Signed)
He takes repatha 140 mg every 14 days and zetia 10 mg daily but his LDL is not target controlled. I will enrol him in hypercholesteremia program.

## 2023-05-24 NOTE — Progress Notes (Signed)
Office Visit  Subjective   Patient ID: Ruben Jackson   DOB: 25-Jan-1951   Age: 72 y.o.   MRN: [id number]   Chief Complaint Chief Complaint  Patient presents with   3 month follow up    Coronary artery disease involving native coronary artery of native heart without angina pectoris     History of Present Illness The patient is a 72 year old Caucasian/White male who presents for a FOLLOW-UP. He says that his memory is Jowett better. He is on aricept 5 mg daily. I had increased the dose of aricept to 10 mg , he is more tearfully and he cut back to 5 mg and he is doing better. I have discussed with wife about adding Namenda but wife wanted to hold and monitor. No behavior problem.    He has hypertension. The patient has not Jackson checking his blood pressure at home. The patient's current medications include: losartan 25 mg tablet and METOPROLOL TARTRATE 50MG  TABLETS. The patient has Jackson tolerating his medications well. The patient denies any chest pain, shortness of breath, orthopnea, and PND.  He reports there have Jackson no other symptoms noted.  Ruben Jackson returns today for routine follow up on his cholesterol. He is taking zetia and repatha injection regularly. His LDL is not target controlled. I will enrol him in chronic care management. He could not tolerate statin because of mucle pain. .   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   HENT: Negative.    Respiratory: Negative.    Cardiovascular: Negative.   Gastrointestinal: Negative.   Neurological: Negative.        Objective:    Vitals BP 122/78 (BP Location: Left Arm, Patient Position: Sitting, Cuff Size: Normal)   Pulse (!) 57   Temp 97.8 F (36.6 C)   Resp 18   Ht 6\' 4"  (1.93 m)   Wt 184 lb (83.5 kg)   SpO2 96%   BMI 22.40 kg/m    Physical Examination Physical Exam Constitutional:      Appearance: Normal appearance.  HENT:     Head: Normocephalic and atraumatic.  Cardiovascular:     Rate and Rhythm: Normal rate and regular rhythm.     Heart sounds: Normal heart sounds.  Pulmonary:     Effort: Pulmonary effort is normal.     Breath sounds: Normal breath sounds.  Abdominal:     General: Bowel sounds are normal.     Palpations: Abdomen is soft.  Neurological:     General: No focal deficit present.     Mental Status: He is alert and oriented to person, place, and time.        Assessment & Plan:   Hypertension We will enrol him in hypertension and hyperlipidemia program. He will sign consent.   CAD (coronary artery disease) No chest pain  or SOB.   Dementia (HCC) I will continue with donepezil 5 mg daily.   Dyslipidemia He takes repatha 140 mg every 14 days and zetia 10 mg daily but his LDL is not target controlled. I will enrol him in hypercholesteremia program.   Myalgia due to statin He could not take any statin due to body ache.     Return in about 3 months (around 08/24/2023).   Ruben Northern, MD

## 2023-05-24 NOTE — Assessment & Plan Note (Signed)
We will enrol him in hypertension and hyperlipidemia program. He will sign consent.

## 2023-06-25 ENCOUNTER — Other Ambulatory Visit: Payer: Self-pay | Admitting: Internal Medicine

## 2023-07-29 ENCOUNTER — Other Ambulatory Visit: Payer: Self-pay | Admitting: Internal Medicine

## 2023-08-25 ENCOUNTER — Ambulatory Visit: Payer: PPO | Admitting: Internal Medicine

## 2023-08-25 ENCOUNTER — Encounter: Payer: Self-pay | Admitting: Internal Medicine

## 2023-08-25 VITALS — BP 122/80 | HR 74 | Temp 98.0°F | Resp 18 | Ht 76.0 in | Wt 184.0 lb

## 2023-08-25 DIAGNOSIS — I2510 Atherosclerotic heart disease of native coronary artery without angina pectoris: Secondary | ICD-10-CM

## 2023-08-25 DIAGNOSIS — E785 Hyperlipidemia, unspecified: Secondary | ICD-10-CM | POA: Diagnosis not present

## 2023-08-25 DIAGNOSIS — F02B0 Dementia in other diseases classified elsewhere, moderate, without behavioral disturbance, psychotic disturbance, mood disturbance, and anxiety: Secondary | ICD-10-CM

## 2023-08-25 DIAGNOSIS — G720 Drug-induced myopathy: Secondary | ICD-10-CM

## 2023-08-25 DIAGNOSIS — T466X5A Adverse effect of antihyperlipidemic and antiarteriosclerotic drugs, initial encounter: Secondary | ICD-10-CM

## 2023-08-25 DIAGNOSIS — G301 Alzheimer's disease with late onset: Secondary | ICD-10-CM | POA: Diagnosis not present

## 2023-08-25 DIAGNOSIS — I10 Essential (primary) hypertension: Secondary | ICD-10-CM

## 2023-08-25 MED ORDER — RIVASTIGMINE 4.6 MG/24HR TD PT24: 4.6000 mg | MEDICATED_PATCH | Freq: Every day | TRANSDERMAL | 6 refills | Status: DC

## 2023-08-25 MED ORDER — REPATHA 140 MG/ML ~~LOC~~ SOSY: 140.0000 mg | PREFILLED_SYRINGE | SUBCUTANEOUS | 11 refills | Status: DC

## 2023-08-25 NOTE — Assessment & Plan Note (Signed)
stable

## 2023-08-25 NOTE — Progress Notes (Addendum)
Office Visit  Subjective   Patient ID: Ruben Jackson   DOB: Mar 16, 1951   Age: 73 y.o.   MRN: [id number]   Chief Complaint Chief Complaint  Patient presents with   Follow up    3 month follow up Coronary artery disease     History of Present Illness The patient is a 73 year old Caucasian/White male who presents for a FOLLOW-UP.   Per wife his memory is bad and she is worried about his driving. He sometime can drive up to [place] from [place]. No accident. He take donepezil 5 mg daily.  His 3 words recall in our office is 2/3 but he is very slow to remember words. I have discussed with wife about adding Namenda but wife wanted to hold and monitor. No behavior problem.    He has hypertension. The patient has not been checking his blood pressure at home. The patient's current medications include: losartan 25 mg tablet and METOPROLOL TARTRATE 50MG  TABLETS. The patient has been tolerating his medications well. The patient denies any chest pain, shortness of breath, orthopnea, and PND.  He reports there have been no other symptoms noted.   Ruben Jackson returns today for routine follow up on his cholesterol. He is taking zetia and repatha injection regularly. His LDL is not target controlled.   His wife is asking about stopping his repeat a 3rd injection.  I have explained to her that he has a coronary artery disease and his LDL should be below 70. He could not tolerate any statin because of muscle pain..   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   HENT: Negative.    Respiratory: Negative.    Cardiovascular: Negative.   Gastrointestinal: Negative.   Neurological: Negative.   Psychiatric/Behavioral:  Positive for memory loss.        Objective:    Vitals BP 122/80 (BP Location: Left Arm, Patient Position: Sitting, Cuff Size: Normal)   Pulse 74   Temp 98 F (36.7 C)   Resp 18   Ht 6\' 4"  (1.93 m)   Wt 184 lb (83.5 kg)   SpO2 99%   BMI 22.40 kg/m    Physical Examination Physical Exam Constitutional:      Appearance: Normal appearance.  HENT:     Head: Normocephalic and atraumatic.  Cardiovascular:     Rate and Rhythm: Normal rate and regular rhythm.     Heart sounds: Normal heart sounds.  Pulmonary:     Effort: Pulmonary effort is normal.     Breath sounds: Normal breath sounds.  Abdominal:     General: Bowel sounds are normal.     Palpations: Abdomen is soft.  Neurological:     General: No focal deficit present.     Mental Status: He is alert and oriented to person, place, and time.  Assessment & Plan:   CAD (coronary artery disease)   Stable, he does not have any chest pain or shortness of breath.  His LDL need to be below 70.  Hypertension  stable  Dementia (HCC)  He has moderate dementia and I have discussed with the wife that he should not be driving, maximum he can just drive locally.  Wife does not wanted to take him for driving assessment.  I will also add Exelon patch 1 daily.  Dyslipidemia   I will do lipid panel.    Return in about 3 months (around 11/23/2023).   Eloisa Northern, MD

## 2023-08-25 NOTE — Assessment & Plan Note (Signed)
Stable, he does not have any chest pain or shortness of breath.  His LDL need to be below 70.

## 2023-08-25 NOTE — Assessment & Plan Note (Signed)
He has moderate dementia and I have discussed with the wife that he should not be driving, maximum he can just drive locally.  Wife does not wanted to take him for driving assessment.  I will also add Exelon patch 1 daily.

## 2023-08-25 NOTE — Assessment & Plan Note (Signed)
I will do lipid panel

## 2023-08-26 LAB — CMP14 + ANION GAP
ALT: 25 [IU]/L (ref 0–44)
AST: 26 [IU]/L (ref 0–40)
Albumin: 4.6 g/dL (ref 3.8–4.8)
Alkaline Phosphatase: 76 [IU]/L (ref 44–121)
Anion Gap: 14 mmol/L (ref 10.0–18.0)
BUN/Creatinine Ratio: 18 (ref 10–24)
BUN: 15 mg/dL (ref 8–27)
Bilirubin Total: 0.4 mg/dL (ref 0.0–1.2)
CO2: 25 mmol/L (ref 20–29)
Calcium: 9.4 mg/dL (ref 8.6–10.2)
Chloride: 103 mmol/L (ref 96–106)
Creatinine, Ser: 0.84 mg/dL (ref 0.76–1.27)
Globulin, Total: 2.3 g/dL (ref 1.5–4.5)
Glucose: 99 mg/dL (ref 70–99)
Potassium: 5.4 mmol/L — ABNORMAL HIGH (ref 3.5–5.2)
Sodium: 142 mmol/L (ref 134–144)
Total Protein: 6.9 g/dL (ref 6.0–8.5)
eGFR: 92 mL/min/{1.73_m2} (ref >59)

## 2023-08-26 LAB — LIPID PANEL
Chol/HDL Ratio: 3.7 {ratio} (ref 0.0–5.0)
Cholesterol, Total: 229 mg/dL — ABNORMAL HIGH (ref 100–199)
HDL: 62 mg/dL (ref >39)
LDL Chol Calc (NIH): 148 mg/dL — ABNORMAL HIGH (ref 0–99)
Triglycerides: 107 mg/dL (ref 0–149)
VLDL Cholesterol Cal: 19 mg/dL (ref 5–40)

## 2023-09-06 ENCOUNTER — Other Ambulatory Visit: Payer: Self-pay

## 2023-09-07 ENCOUNTER — Other Ambulatory Visit: Payer: Self-pay

## 2023-11-02 ENCOUNTER — Other Ambulatory Visit: Payer: Self-pay | Admitting: Internal Medicine

## 2023-11-17 ENCOUNTER — Ambulatory Visit: Payer: PPO | Admitting: Internal Medicine

## 2023-11-30 ENCOUNTER — Other Ambulatory Visit: Payer: Self-pay | Admitting: Internal Medicine

## 2024-02-07 ENCOUNTER — Other Ambulatory Visit: Payer: Self-pay | Admitting: Internal Medicine

## 2024-02-10 ENCOUNTER — Other Ambulatory Visit: Payer: Self-pay | Admitting: Internal Medicine

## 2024-02-18 ENCOUNTER — Ambulatory Visit: Admitting: Internal Medicine

## 2024-02-18 ENCOUNTER — Encounter: Payer: Self-pay | Admitting: Internal Medicine

## 2024-02-18 VITALS — BP 124/80 | HR 55 | Temp 98.1°F | Resp 18 | Ht 76.0 in | Wt 186.4 lb

## 2024-02-18 DIAGNOSIS — I10 Essential (primary) hypertension: Secondary | ICD-10-CM | POA: Diagnosis not present

## 2024-02-18 DIAGNOSIS — G720 Drug-induced myopathy: Secondary | ICD-10-CM | POA: Diagnosis not present

## 2024-02-18 DIAGNOSIS — F02B0 Dementia in other diseases classified elsewhere, moderate, without behavioral disturbance, psychotic disturbance, mood disturbance, and anxiety: Secondary | ICD-10-CM

## 2024-02-18 DIAGNOSIS — E782 Mixed hyperlipidemia: Secondary | ICD-10-CM

## 2024-02-18 DIAGNOSIS — I2510 Atherosclerotic heart disease of native coronary artery without angina pectoris: Secondary | ICD-10-CM

## 2024-02-18 DIAGNOSIS — T466X5A Adverse effect of antihyperlipidemic and antiarteriosclerotic drugs, initial encounter: Secondary | ICD-10-CM | POA: Diagnosis not present

## 2024-02-18 MED ORDER — NEXLETOL 180 MG PO TABS: 1.0000 | ORAL_TABLET | Freq: Every day | ORAL | 6 refills | Status: AC

## 2024-02-18 NOTE — Assessment & Plan Note (Signed)
His blood pressure is controlled.

## 2024-02-18 NOTE — Assessment & Plan Note (Signed)
 He has tried different statin and could not tolerate.

## 2024-02-18 NOTE — Assessment & Plan Note (Signed)
 He will continue with as it might 10 mg daily and I will add Nexleton 180 mg daily.  I will repeat lipid panel on next visit.

## 2024-02-18 NOTE — Progress Notes (Signed)
 Office Visit  Subjective   Patient ID: Ruben Jackson   DOB: August 27, 1950   Age: 73 y.o.   MRN: [id number]   Chief Complaint Chief Complaint  Patient presents with   Coronary Artery Disease    3 month follow     History of Present Illness The patient is a 73 year old Caucasian/White male who presents for a FOLLOW-UP with his wife.SABRA  He is using Exelon  patch 4.5 mg daily.  He thinks that he is feeling better but his wife feels that he is the same.  He goes out and take care of his [place]ows.  He does not get lost.  Sometime he cannot remember the word and wife need to help him in this regard.  No behavioral issue.    He has hypertension. The patient has not been checking his blood pressure at home. The patient's current medications include: losartan  25 mg tablet and METOPROLOL TARTRATE 50MG  TABLETS. The patient has been tolerating his medications well. The patient denies any chest pain, shortness of breath, orthopnea, and PND.    Ruben Jackson returns today for routine follow up on his cholesterol. He is taking zetia  and  Per wife his insurance company denies Repatha  injection.  He has a history of coronary artery disease and his LDL was 148. Wife is asking if there is any other option to bring his cholesterol down. He could not tolerate any statin because of muscle pain..   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   HENT: Negative.    Respiratory: Negative.    Cardiovascular: Negative.   Gastrointestinal: Negative.   Neurological: Negative.   Psychiatric/Behavioral:  Positive for memory loss.        Objective:    Vitals BP 124/80   Pulse (!) 55   Temp 98.1 F (36.7 C)   Resp 18   Ht 6' 4 (1.93 m)   Wt 186 lb 6 oz (84.5 kg)   SpO2 98%   BMI 22.69 kg/m    Physical Examination Physical Exam Constitutional:      Appearance: Normal appearance.  Cardiovascular:     Rate and Rhythm: Normal rate and regular rhythm.     Heart sounds: Normal heart sounds.  Pulmonary:     Effort: Pulmonary effort is normal.     Breath sounds: Normal breath sounds.  Abdominal:     General: Bowel sounds are normal.     Palpations: Abdomen is soft.  Neurological:     General: No focal deficit present.     Mental Status: He is alert and oriented to person, place, and time.        Assessment & Plan:   Hypertension   His blood pressure is controlled.  Coronary artery disease involving native coronary artery of native heart without angina pectoris   No more chest  pain and he has not seen cardiologist lately.  Dementia (HCC)   I will continue with Exelon  patch.  Wife does not wanted to add Namenda.  Will continue to monitor.  Statin myopathy   He has tried different statin and could not tolerate.  Hyperlipidemia   He will continue with as it might 10 mg daily and I will add Nexleton 180 mg daily.  I will repeat lipid panel on next visit.    Return in about 3 months (around 05/20/2024).   Roetta Dare, MD

## 2024-02-18 NOTE — Assessment & Plan Note (Signed)
 I will continue with Exelon  patch.  Wife does not wanted to add Namenda.  Will continue to monitor.

## 2024-02-18 NOTE — Assessment & Plan Note (Signed)
 No more chest pain and he has not seen cardiologist lately.

## 2024-04-27 ENCOUNTER — Other Ambulatory Visit: Payer: Self-pay | Admitting: Internal Medicine

## 2024-05-03 ENCOUNTER — Other Ambulatory Visit: Payer: Self-pay | Admitting: Internal Medicine

## 2024-05-19 ENCOUNTER — Ambulatory Visit: Admitting: Internal Medicine

## 2024-05-26 ENCOUNTER — Ambulatory Visit: Admitting: Internal Medicine

## 2024-05-26 ENCOUNTER — Encounter: Payer: Self-pay | Admitting: Internal Medicine

## 2024-05-26 VITALS — BP 122/70 | HR 85 | Temp 98.3°F | Resp 18 | Ht 76.0 in | Wt 185.5 lb

## 2024-05-26 DIAGNOSIS — D2239 Melanocytic nevi of other parts of face: Secondary | ICD-10-CM | POA: Diagnosis not present

## 2024-05-26 DIAGNOSIS — G301 Alzheimer's disease with late onset: Secondary | ICD-10-CM | POA: Diagnosis not present

## 2024-05-26 DIAGNOSIS — I2510 Atherosclerotic heart disease of native coronary artery without angina pectoris: Secondary | ICD-10-CM | POA: Diagnosis not present

## 2024-05-26 DIAGNOSIS — F02B0 Dementia in other diseases classified elsewhere, moderate, without behavioral disturbance, psychotic disturbance, mood disturbance, and anxiety: Secondary | ICD-10-CM

## 2024-05-26 DIAGNOSIS — Z23 Encounter for immunization: Secondary | ICD-10-CM | POA: Diagnosis not present

## 2024-05-26 DIAGNOSIS — E782 Mixed hyperlipidemia: Secondary | ICD-10-CM | POA: Diagnosis not present

## 2024-05-26 DIAGNOSIS — I10 Essential (primary) hypertension: Secondary | ICD-10-CM | POA: Diagnosis not present

## 2024-05-26 DIAGNOSIS — D225 Melanocytic nevi of trunk: Secondary | ICD-10-CM | POA: Diagnosis not present

## 2024-05-26 DIAGNOSIS — L570 Actinic keratosis: Secondary | ICD-10-CM | POA: Diagnosis not present

## 2024-05-26 DIAGNOSIS — L820 Inflamed seborrheic keratosis: Secondary | ICD-10-CM | POA: Diagnosis not present

## 2024-05-26 MED ORDER — NITROGLYCERIN 0.4 MG SL SUBL: 0.4000 mg | SUBLINGUAL_TABLET | SUBLINGUAL | 1 refills | Status: AC | PRN

## 2024-05-26 NOTE — Assessment & Plan Note (Signed)
 His LDL was not at target control.  I will do lipid panel and CMP today.

## 2024-05-26 NOTE — Progress Notes (Signed)
 Office Visit  Subjective   Patient ID: Ruben Jackson   DOB: 08/07/1950   Age: 73 y.o.   MRN: [id number]   Chief Complaint Chief Complaint  Patient presents with   Hypertension    3 month follow up     History of Present Illness The patient is a 73 year old Caucasian/White male who presents for a FOLLOW-UP with his wife.  Per wife he has heart attack years ago and he was given prescription of nitroglycerin  but he never took it and he no longer have that medicine with him.  She wanted to know if he need nitroglycerin  tablet.  He do not have any chest pain and have not follow-up with cardiologist in a long time.   He has hyperlipidemia and he could not tolerate any statin.  He take as ET might 10 mg daily and Repatha  injection 140 mg twice a month.  His last LDL was done January 2025 was 148. That is not controlled so will repeat lipid panel today.    He has dementia without any behavioral problem.  Per wife his memory is not better but it is not worse either. He is using Exelon  patch 4.5 mg daily.     He has hypertension. The patient has not been checking his blood pressure at home. The patient's current medications include: losartan  25 mg tablet and METOPROLOL TARTRATE 50MG  TABLETS. The patient has been tolerating his medications well. The patient denies any chest pain, shortness of breath, orthopnea, and PND.      He is due for flu shot today.   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   HENT: Negative.    Respiratory: Negative.    Cardiovascular: Negative.   Gastrointestinal: Negative.   Neurological: Negative.        Objective:    Vitals BP 122/70 (BP Location: Left Arm, Patient Position: Sitting, Cuff Size: Normal)   Pulse 85   Temp 98.3 F (36.8 C)   Resp 18   Ht 6' 4 (1.93 m)   Wt 185 lb 8 oz (84.1 kg)   SpO2 97%   BMI 22.58 kg/m    Physical Examination Physical Exam Constitutional:      Appearance: Normal appearance.  HENT:     Head: Normocephalic and atraumatic.  Eyes:     Extraocular Movements: Extraocular movements intact.     Pupils: Pupils are equal, round, and reactive to light.  Cardiovascular:     Rate and Rhythm: Normal rate and regular rhythm.     Heart sounds: Normal heart sounds.  Pulmonary:     Effort: Pulmonary effort is normal.     Breath sounds: Normal breath sounds.  Abdominal:     General: Bowel sounds are normal.     Palpations: Abdomen is soft.  Neurological:     General: No focal  deficit present.     Mental Status: He is alert and oriented to person, place, and time.        Assessment & Plan:   Hypertension  His blood pressure is well controlled.  Coronary artery disease involving native coronary artery of native heart without angina pectoris   He has no chest pain but he will keep sublingual nitroglycerin  in case if he has chest pain.  He take aspirin 81 mg for cardiovascular prevention.  Dementia (HCC)   Stable without any side effects so will continue with Exelon  patch.  Hyperlipidemia   His LDL was not at target control.  I will do lipid panel and CMP today.    Return in about 3 months (around 08/26/2024).   Roetta Dare, MD

## 2024-05-26 NOTE — Assessment & Plan Note (Signed)
 Stable without any side effects so will continue with Exelon  patch.

## 2024-05-26 NOTE — Addendum Note (Signed)
 Addended by: Merrie Epler on: 05/26/2024 03:04 PM   Modules accepted: Orders

## 2024-05-26 NOTE — Assessment & Plan Note (Signed)
 He has no chest pain but he will keep sublingual nitroglycerin  in case if he has chest pain.  He take aspirin 81 mg for cardiovascular prevention.

## 2024-05-26 NOTE — Assessment & Plan Note (Signed)
His blood pressure is well-controlled 

## 2024-05-27 LAB — VITAMIN D 25 HYDROXY (VIT D DEFICIENCY, FRACTURES): Vit D, 25-Hydroxy: 31 ng/mL (ref 30.0–100.0)

## 2024-05-27 LAB — CMP14 + ANION GAP
ALT: 26 IU/L (ref 0–44)
AST: 26 IU/L (ref 0–40)
Albumin: 4.4 g/dL (ref 3.8–4.8)
Alkaline Phosphatase: 62 IU/L (ref 47–123)
Anion Gap: 12 mmol/L (ref 10.0–18.0)
BUN/Creatinine Ratio: 25 — ABNORMAL HIGH (ref 10–24)
BUN: 19 mg/dL (ref 8–27)
Bilirubin Total: 0.4 mg/dL (ref 0.0–1.2)
CO2: 24 mmol/L (ref 20–29)
Calcium: 8.8 mg/dL (ref 8.6–10.2)
Chloride: 104 mmol/L (ref 96–106)
Creatinine, Ser: 0.75 mg/dL — ABNORMAL LOW (ref 0.76–1.27)
Globulin, Total: 2.1 g/dL (ref 1.5–4.5)
Glucose: 93 mg/dL (ref 70–99)
Potassium: 4 mmol/L (ref 3.5–5.2)
Sodium: 140 mmol/L (ref 134–144)
Total Protein: 6.5 g/dL (ref 6.0–8.5)
eGFR: 95 mL/min/1.73 (ref >59)

## 2024-05-27 LAB — LIPID PANEL
Chol/HDL Ratio: 2.5 ratio (ref 0.0–5.0)
Cholesterol, Total: 146 mg/dL (ref 100–199)
HDL: 59 mg/dL (ref >39)
LDL Chol Calc (NIH): 74 mg/dL (ref 0–99)
Triglycerides: 64 mg/dL (ref 0–149)
VLDL Cholesterol Cal: 13 mg/dL (ref 5–40)

## 2024-06-07 ENCOUNTER — Ambulatory Visit: Payer: Self-pay

## 2024-07-03 DIAGNOSIS — Z961 Presence of intraocular lens: Secondary | ICD-10-CM | POA: Diagnosis not present

## 2024-07-03 DIAGNOSIS — H524 Presbyopia: Secondary | ICD-10-CM | POA: Diagnosis not present

## 2024-07-03 DIAGNOSIS — H35372 Puckering of macula, left eye: Secondary | ICD-10-CM | POA: Diagnosis not present

## 2024-08-10 ENCOUNTER — Other Ambulatory Visit: Payer: Self-pay

## 2024-08-10 MED ORDER — REPATHA 140 MG/ML ~~LOC~~ SOSY: 140.0000 mg | PREFILLED_SYRINGE | SUBCUTANEOUS | 11 refills | Status: DC

## 2024-08-10 MED ORDER — RIVASTIGMINE 4.6 MG/24HR TD PT24: 4.6000 mg | MEDICATED_PATCH | Freq: Every day | TRANSDERMAL | 1 refills | Status: AC

## 2024-08-25 ENCOUNTER — Encounter: Payer: Self-pay | Admitting: Internal Medicine

## 2024-08-25 ENCOUNTER — Ambulatory Visit: Admitting: Internal Medicine

## 2024-08-25 VITALS — BP 124/70 | HR 56 | Temp 97.6°F | Resp 18 | Ht 77.0 in | Wt 187.2 lb

## 2024-08-25 DIAGNOSIS — I2510 Atherosclerotic heart disease of native coronary artery without angina pectoris: Secondary | ICD-10-CM

## 2024-08-25 DIAGNOSIS — F02B0 Dementia in other diseases classified elsewhere, moderate, without behavioral disturbance, psychotic disturbance, mood disturbance, and anxiety: Secondary | ICD-10-CM

## 2024-08-25 DIAGNOSIS — I10 Essential (primary) hypertension: Secondary | ICD-10-CM

## 2024-08-25 DIAGNOSIS — G301 Alzheimer's disease with late onset: Secondary | ICD-10-CM

## 2024-08-25 DIAGNOSIS — E782 Mixed hyperlipidemia: Secondary | ICD-10-CM

## 2024-08-25 MED ORDER — MEMANTINE HCL 5 MG PO TABS: 5.0000 mg | ORAL_TABLET | Freq: Two times a day (BID) | ORAL | 4 refills | Status: AC

## 2024-08-25 NOTE — Assessment & Plan Note (Signed)
"   I have discussed the progression of disease with wife.  I will continue with the Exelon  patch 4.6 mg daily and will add memantine  5 mg twice a day. "

## 2024-08-25 NOTE — Assessment & Plan Note (Signed)
His blood pressure is well-controlled 

## 2024-08-25 NOTE — Progress Notes (Signed)
 "  Office Visit  Subjective   Patient ID: Ruben Jackson   DOB: Sep 23, 1950   Age: 74 y.o.   MRN: [id number]   Chief Complaint Chief Complaint  Patient presents with   Follow-up    3 month     History of Present Illness The patient is a 74 year old Caucasian/White male who presents for a FOLLOW-UP with his wife.   Per wife she has noticed the difference with Exelon  patch in his memory.  He denies any other complaint.  No nausea or any side effects from the patch.  He has gained 2 lb since last visit.  His weight is 187 lb with BMI of 22.    He has coronary artery disease and he has not seen any cardiologist in a while.  He did not have any chest pain. Per wife he has heart attack years ago and he was given prescription of nitroglycerin  but he never took it and he no longer have that medicine with him.      He has hyperlipidemia and he could not tolerate any statin.  He take ezetimibe  10 mg daily and Repatha  injection 140 mg twice a month.  His last LDL was done   May 26, 2024 shows LDL was target controlled.  He denies any side effects.   He has hypertension. The patient has not been checking his blood pressure at home. The patient's current medications include: losartan  25 mg tablet and METOPROLOL TARTRATE 50MG  TABLETS. The patient has been tolerating his medications well. The patient denies any chest pain, shortness of breath, orthopnea, and PND.   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    HENT: Negative.    Respiratory: Negative.    Cardiovascular: Negative.   Gastrointestinal: Negative.   Neurological: Negative.   Psychiatric/Behavioral:  Positive for memory loss.        Objective:    Vitals BP 124/70   Pulse (!) 56   Temp 97.6 F (36.4 C)   Resp 18   Ht 6' 5 (1.956 m)   Wt 187 lb 4 oz (84.9 kg)   SpO2 99%   BMI 22.20 kg/m    Physical Examination Physical Exam Constitutional:      Appearance: Normal appearance.  HENT:     Head: Normocephalic and atraumatic.  Cardiovascular:     Rate and Rhythm: Normal rate and regular rhythm.     Heart sounds: Normal heart sounds.  Pulmonary:     Effort: Pulmonary effort is normal.     Breath sounds: Normal breath sounds.  Abdominal:     General: Bowel sounds are normal.     Palpations: Abdomen is soft.  Neurological:     General: No focal deficit present.     Mental Status: He is alert and oriented to person, place, and time.        Assessment & Plan:   Hypertension   His blood pressure is well controlled.  CAD (coronary artery disease)   Stable  Dementia (  HCC)  I have discussed the progression of disease with wife.  I will continue with the Exelon  patch 4.6 mg daily and will add memantine  5 mg twice a day.  Hyperlipidemia   His LDL is target control.    Return in about 3 months (around 11/23/2024).   Roetta Dare, MD      [1]  Allergies Allergen Reactions   Lisinopril Other (See Comments)   Penicillins Other (See Comments)   Statins Other (See Comments)    mylgia   "

## 2024-08-25 NOTE — Assessment & Plan Note (Signed)
 Stable

## 2024-08-25 NOTE — Assessment & Plan Note (Signed)
His LDL is target control 

## 2024-08-26 ENCOUNTER — Other Ambulatory Visit: Payer: Self-pay | Admitting: Internal Medicine

## 2024-08-30 ENCOUNTER — Other Ambulatory Visit: Payer: Self-pay

## 2024-08-30 MED ORDER — REPATHA 140 MG/ML ~~LOC~~ SOSY: 140.0000 mg | PREFILLED_SYRINGE | SUBCUTANEOUS | 11 refills | Status: AC

## 2024-11-17 ENCOUNTER — Ambulatory Visit: Admitting: Internal Medicine
